# Patient Record
Sex: Male | Born: 1965 | Race: White | Hispanic: No | Marital: Single | Smoking: Never smoker
Health system: Southern US, Community
[De-identification: ages and names within clinical notes are randomized; demographics above are authoritative.]

## PROBLEM LIST (undated history)

## (undated) DIAGNOSIS — R569 Unspecified convulsions: Secondary | ICD-10-CM

## (undated) DIAGNOSIS — F32A Depression, unspecified: Secondary | ICD-10-CM

## (undated) DIAGNOSIS — F429 Obsessive-compulsive disorder, unspecified: Secondary | ICD-10-CM

## (undated) DIAGNOSIS — F329 Major depressive disorder, single episode, unspecified: Secondary | ICD-10-CM

## (undated) DIAGNOSIS — E785 Hyperlipidemia, unspecified: Secondary | ICD-10-CM

## (undated) DIAGNOSIS — F840 Autistic disorder: Secondary | ICD-10-CM

## (undated) HISTORY — DX: Major depressive disorder, single episode, unspecified: F32.9

## (undated) HISTORY — DX: Autistic disorder: F84.0

## (undated) HISTORY — DX: Unspecified convulsions: R56.9

## (undated) HISTORY — DX: Obsessive-compulsive disorder, unspecified: F42.9

## (undated) HISTORY — DX: Depression, unspecified: F32.A

## (undated) HISTORY — PX: TOOTH EXTRACTION: SUR596

## (undated) HISTORY — DX: Hyperlipidemia, unspecified: E78.5

---

## 1998-03-17 ENCOUNTER — Encounter: Admission: RE | Admit: 1998-03-17 | Discharge: 1998-03-17 | Payer: Self-pay | Admitting: Family Medicine

## 1999-02-05 ENCOUNTER — Encounter: Admission: RE | Admit: 1999-02-05 | Discharge: 1999-02-05 | Payer: Self-pay | Admitting: Family Medicine

## 1999-08-09 ENCOUNTER — Encounter: Admission: RE | Admit: 1999-08-09 | Discharge: 1999-08-09 | Payer: Self-pay | Admitting: Family Medicine

## 2000-01-02 ENCOUNTER — Encounter: Admission: RE | Admit: 2000-01-02 | Discharge: 2000-01-02 | Payer: Self-pay | Admitting: Family Medicine

## 2000-02-07 ENCOUNTER — Encounter: Admission: RE | Admit: 2000-02-07 | Discharge: 2000-02-07 | Payer: Self-pay | Admitting: Family Medicine

## 2000-04-30 ENCOUNTER — Encounter: Admission: RE | Admit: 2000-04-30 | Discharge: 2000-04-30 | Payer: Self-pay | Admitting: Family Medicine

## 2001-02-16 ENCOUNTER — Encounter: Admission: RE | Admit: 2001-02-16 | Discharge: 2001-02-16 | Payer: Self-pay | Admitting: Family Medicine

## 2001-07-17 ENCOUNTER — Encounter: Admission: RE | Admit: 2001-07-17 | Discharge: 2001-07-17 | Payer: Self-pay | Admitting: Family Medicine

## 2003-11-02 ENCOUNTER — Encounter: Admission: RE | Admit: 2003-11-02 | Discharge: 2003-11-02 | Payer: Self-pay | Admitting: Sports Medicine

## 2003-11-18 ENCOUNTER — Encounter: Admission: RE | Admit: 2003-11-18 | Discharge: 2003-11-18 | Payer: Self-pay | Admitting: Family Medicine

## 2003-12-15 ENCOUNTER — Encounter: Admission: RE | Admit: 2003-12-15 | Discharge: 2003-12-15 | Payer: Self-pay | Admitting: Family Medicine

## 2004-11-21 ENCOUNTER — Emergency Department (HOSPITAL_COMMUNITY): Admission: EM | Admit: 2004-11-21 | Discharge: 2004-11-21 | Payer: Self-pay | Admitting: Emergency Medicine

## 2006-11-17 ENCOUNTER — Ambulatory Visit: Payer: Self-pay | Admitting: Family Medicine

## 2006-12-11 ENCOUNTER — Encounter (INDEPENDENT_AMBULATORY_CARE_PROVIDER_SITE_OTHER): Payer: Self-pay | Admitting: Family Medicine

## 2006-12-11 ENCOUNTER — Ambulatory Visit: Payer: Self-pay | Admitting: Family Medicine

## 2006-12-11 LAB — CONVERTED CEMR LAB
Cholesterol: 251 mg/dL — ABNORMAL HIGH (ref 0–200)
HDL: 37 mg/dL — ABNORMAL LOW (ref >39)
Total CHOL/HDL Ratio: 6.8
VLDL: 42 mg/dL — ABNORMAL HIGH (ref 0–40)

## 2007-01-08 DIAGNOSIS — F32A Depression, unspecified: Secondary | ICD-10-CM | POA: Insufficient documentation

## 2007-01-08 DIAGNOSIS — E785 Hyperlipidemia, unspecified: Secondary | ICD-10-CM

## 2007-01-08 DIAGNOSIS — F329 Major depressive disorder, single episode, unspecified: Secondary | ICD-10-CM

## 2007-01-08 DIAGNOSIS — R569 Unspecified convulsions: Secondary | ICD-10-CM

## 2007-01-08 DIAGNOSIS — F429 Obsessive-compulsive disorder, unspecified: Secondary | ICD-10-CM | POA: Insufficient documentation

## 2007-01-16 ENCOUNTER — Ambulatory Visit: Payer: Self-pay | Admitting: Sports Medicine

## 2007-01-16 DIAGNOSIS — F840 Autistic disorder: Secondary | ICD-10-CM | POA: Insufficient documentation

## 2007-07-31 ENCOUNTER — Encounter (INDEPENDENT_AMBULATORY_CARE_PROVIDER_SITE_OTHER): Payer: Self-pay | Admitting: Family Medicine

## 2007-08-10 ENCOUNTER — Telehealth: Payer: Self-pay | Admitting: *Deleted

## 2007-08-13 ENCOUNTER — Ambulatory Visit: Payer: Self-pay | Admitting: Family Medicine

## 2007-08-13 ENCOUNTER — Encounter (INDEPENDENT_AMBULATORY_CARE_PROVIDER_SITE_OTHER): Payer: Self-pay | Admitting: Family Medicine

## 2007-08-13 LAB — CONVERTED CEMR LAB
Alkaline Phosphatase: 76 units/L (ref 39–117)
BUN: 10 mg/dL (ref 6–23)
CO2: 25 meq/L (ref 19–32)
Creatinine, Ser: 1.17 mg/dL (ref 0.40–1.50)
Glucose, Bld: 98 mg/dL (ref 70–99)
Sodium: 142 meq/L (ref 135–145)
Total Bilirubin: 0.4 mg/dL (ref 0.3–1.2)
Total Protein: 8 g/dL (ref 6.0–8.3)

## 2007-10-05 ENCOUNTER — Ambulatory Visit: Payer: Self-pay | Admitting: Family Medicine

## 2008-02-18 ENCOUNTER — Encounter (INDEPENDENT_AMBULATORY_CARE_PROVIDER_SITE_OTHER): Payer: Self-pay | Admitting: *Deleted

## 2008-03-22 ENCOUNTER — Ambulatory Visit: Payer: Self-pay | Admitting: Family Medicine

## 2008-03-30 ENCOUNTER — Encounter (INDEPENDENT_AMBULATORY_CARE_PROVIDER_SITE_OTHER): Payer: Self-pay | Admitting: Family Medicine

## 2008-03-30 ENCOUNTER — Ambulatory Visit: Payer: Self-pay | Admitting: Family Medicine

## 2008-03-30 LAB — CONVERTED CEMR LAB: Hgb A1c MFr Bld: 5.3 %

## 2008-04-07 LAB — CONVERTED CEMR LAB
Cholesterol: 225 mg/dL — ABNORMAL HIGH (ref 0–200)
HDL: 40 mg/dL (ref >39)
Total CHOL/HDL Ratio: 5.6
Triglycerides: 187 mg/dL — ABNORMAL HIGH (ref <150)
VLDL: 37 mg/dL (ref 0–40)

## 2009-08-21 ENCOUNTER — Encounter: Payer: Self-pay | Admitting: Family Medicine

## 2009-08-21 ENCOUNTER — Ambulatory Visit: Payer: Self-pay | Admitting: Family Medicine

## 2009-08-28 ENCOUNTER — Telehealth: Payer: Self-pay | Admitting: Family Medicine

## 2009-08-29 ENCOUNTER — Ambulatory Visit: Payer: Self-pay | Admitting: Family Medicine

## 2009-08-29 ENCOUNTER — Encounter: Payer: Self-pay | Admitting: Family Medicine

## 2009-08-29 LAB — CONVERTED CEMR LAB
AST: 27 units/L (ref 0–37)
Albumin: 4.4 g/dL (ref 3.5–5.2)
Alkaline Phosphatase: 65 units/L (ref 39–117)
Glucose, Bld: 86 mg/dL (ref 70–99)
LDL Cholesterol: 192 mg/dL — ABNORMAL HIGH (ref 0–99)
MCHC: 31.1 g/dL (ref 30.0–36.0)
MCV: 89.2 fL (ref 78.0–100.0)
Platelets: 359 10*3/uL (ref 150–400)
Potassium: 4.1 meq/L (ref 3.5–5.3)
RDW: 14 % (ref 11.5–15.5)
Sodium: 141 meq/L (ref 135–145)
TSH: 2.363 microintl units/mL (ref 0.350–4.500)
Total Bilirubin: 0.3 mg/dL (ref 0.3–1.2)
Total Protein: 7.3 g/dL (ref 6.0–8.3)
Triglycerides: 183 mg/dL — ABNORMAL HIGH (ref <150)
VLDL: 37 mg/dL (ref 0–40)

## 2009-09-04 ENCOUNTER — Telehealth: Payer: Self-pay | Admitting: Family Medicine

## 2009-09-05 ENCOUNTER — Telehealth: Payer: Self-pay | Admitting: Family Medicine

## 2009-09-06 ENCOUNTER — Encounter: Payer: Self-pay | Admitting: Family Medicine

## 2009-10-19 ENCOUNTER — Encounter: Payer: Self-pay | Admitting: Family Medicine

## 2010-05-21 ENCOUNTER — Telehealth: Payer: Self-pay | Admitting: Family Medicine

## 2010-05-21 ENCOUNTER — Encounter: Payer: Self-pay | Admitting: Family Medicine

## 2010-05-21 ENCOUNTER — Ambulatory Visit: Payer: Self-pay | Admitting: Family Medicine

## 2010-05-21 LAB — CONVERTED CEMR LAB
AST: 26 units/L (ref 0–37)
Albumin: 4.8 g/dL (ref 3.5–5.2)
Alkaline Phosphatase: 63 units/L (ref 39–117)
Calcium: 9.3 mg/dL (ref 8.4–10.5)
Chloride: 104 meq/L (ref 96–112)
Glucose, Bld: 92 mg/dL (ref 70–99)
Hemoglobin: 13.2 g/dL (ref 13.0–17.0)
MCHC: 30.9 g/dL (ref 30.0–36.0)
Potassium: 5 meq/L (ref 3.5–5.3)
RBC: 4.74 M/uL (ref 4.22–5.81)
RDW: 14.2 % (ref 11.5–15.5)
Sodium: 141 meq/L (ref 135–145)
Total Protein: 7.5 g/dL (ref 6.0–8.3)

## 2010-05-23 ENCOUNTER — Telehealth: Payer: Self-pay | Admitting: Family Medicine

## 2010-05-27 ENCOUNTER — Emergency Department (HOSPITAL_COMMUNITY): Admission: EM | Admit: 2010-05-27 | Discharge: 2010-05-27 | Payer: Self-pay | Admitting: Emergency Medicine

## 2010-10-10 ENCOUNTER — Encounter (INDEPENDENT_AMBULATORY_CARE_PROVIDER_SITE_OTHER): Payer: Self-pay | Admitting: *Deleted

## 2010-12-11 NOTE — Progress Notes (Signed)
  Phone Note Outgoing Call   Call placed by: Angeline Slim MD,  May 23, 2010 8:37 AM Call placed to: Specialist Summary of Call: [facility] back again.  LM that she should fax me her current office visit notes Re pt and his meds.  She can fax it to  [phone].   Initial call taken by: Reshunda Strider MD,  May 23, 2010 8:38 AM

## 2010-12-11 NOTE — Miscellaneous (Signed)
Summary: medical record request  Clinical Lists Changes  Rec'd medical record request to go to [facility] services faxed 07/06/10 Marily Memos  October 10, 2010 11:31 AM

## 2010-12-11 NOTE — Letter (Signed)
Summary: [employer] form  [employer] form   Imported By: De Nurse 05/22/2010 14:56:34  _____________________________________________________________________  External Attachment:    Type:   Image     Comment:   External Document

## 2010-12-11 NOTE — Progress Notes (Signed)
Summary: What Doses Seroqule, Prozac is pt on?  Phone Note Outgoing Call   Call placed by: Angeline Slim MD,  May 21, 2010 1:41 PM Call placed to: Specialist Summary of Call: Tried to call Harriet Long, x 2 today.  She is prescribing Seroquenl and Prozac for pt for OCD, Depression.  Wanted to touch base with her regarding how patient is doing, what doses of meds he's on.  Left message x 2.    Harriet Long also requested FLP, I will fax this after I have spoken to her.   Initial call taken by: Domnic Vantol MD,  May 21, 2010 1:43 PM

## 2010-12-11 NOTE — Assessment & Plan Note (Signed)
Summary: special olympics,df   Vital Signs:  Patient profile:   45 year old male Height:      67 inches Weight:      172 pounds BMI:     27.04 Temp:     97.5 degrees F oral Pulse rate:   80 / minute BP sitting:   123 / 75  (left arm)  Vitals Entered By: Jimmy Footman, CMA (May 21, 2010 8:34 AM) CC: Physical for special olympics Is Patient Diabetic? No Pain Assessment Patient in pain? no        Primary Care Provider:  Nicloe Frontera MD  CC:  Physical for special olympics.  History of Present Illness: 45 y/o M Here for PE for [employer] Olympics, in Aug.  He will be playing soccer and basketball.    Temp address: [address] [place], [place], [place] [address] to mail form  Seizure: Lamictal 200mg  bid Last episode of seizure 08/10/2007 Taking Lamictal since 01/2007  Seroquel and Prozac: OCD, Depression Medical review every 3-6 months, meds are reviewed at these appts.  [facility] Mount Long prescribes these meds.  [facility]  HYPERLIPIDEMIA: ONG:[id number] 40mg  qhs Compliance:yes Prob taking med:no Muscle aches:no Abd  pain:no BMI: 27.04 , change from 26.58 in 08/2009.    Habits & Providers  Alcohol-Tobacco-Diet     Alcohol drinks/day: 0     Alcohol Counseling: not indicated; patient does not drink     Tobacco Status: never  Exercise-Depression-Behavior     Does Patient Exercise: yes     Exercise Counseling: not indicated; exercise is adequate     Type of exercise: training for [employer] Olympics  Current Medications (verified): 1)  Lamictal 200 Mg Tabs (Lamotrigine) .... Take 1 Tablet By Mouth Twice A Day 2)  Prozac .... Prescribed By [employer] [facility] 3)  Seroquel .... Prescribed By [employer] [facility] 4)  Simvastatin 40 Mg Tabs (Simvastatin) .Marland Kitchen.. 1 Tab By Mouth At Bedtime For Cholesterol  Allergies (verified): 1)  Phenobarbital (Phenobarbital)  Past History:  Past Surgical History: Last updated: 01/08/2007 lipid panel 01/08: tc=251, tg=209,  hdl=37, ldl=178 - 12/12/2006  Family History: Last updated: 01/08/2007 No DM, HTN, no ther sz d/o, Skin CA, asthma, heart dz.  Social History: Last updated: 08/21/2009 Works as Public affairs consultant at [employer].  Lives in apt by himself. Parents divorced.  Sees dad every 3-4 months.  No relationship with mom.  Raised by father/maternal GM.  Grandma passed away 06-13-2009.  One brother in good health.  Sister in good health.  Athlete in [employer] ([employer]: plays soccer, basketball, floor hockey).  Member Epilepsy Adult support group.  Risk Factors: Alcohol Use: 0 (05/21/2010) Exercise: yes (05/21/2010)  Risk Factors: Smoking Status: never (05/21/2010)  Past Medical History: 1. 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 No seizure since 03/08 (Sees Dr Alvester Morin at [facility]) 2. OCD: sees International aid/development worker at [facility].  She prescribes Prozac and Seroquel. 3. Depression: same as above  Review of Systems General:  Denies chills, fatigue, fever, loss of appetite, malaise, sleep disorder, weakness, and weight loss. Eyes:  Denies blurring, discharge, eye irritation, and eye pain. ENT:  Denies decreased hearing, difficulty swallowing, ear discharge, earache, hoarseness, nosebleeds, and sore throat. CV:  Denies difficulty breathing at night, difficulty breathing while lying down, fainting, fatigue, leg cramps with exertion, lightheadness, palpitations, shortness of breath with exertion, swelling of feet, and swelling of hands. Resp:  Denies chest discomfort, chest  pain with inspiration, cough, coughing up blood, shortness of breath, sputum productive, and wheezing. GI:  Denies abdominal pain, bloody stools, change in bowel habits, constipation, dark tarry stools, diarrhea, nausea, vomiting, and vomiting blood. GU:  Denies discharge, dysuria, urinary frequency, and urinary hesitancy. MS:  Denies joint pain, joint redness, joint  swelling, loss of strength, low back pain, mid back pain, muscle aches, cramps, muscle weakness, stiffness, and thoracic pain. Derm:  Denies changes in color of skin, changes in nail beds, dryness, lesion(s), poor wound healing, and rash. Neuro:  Complains of headaches; denies brief paralysis, falling down, memory loss, poor balance, and weakness; Sometimes gets HAs, usually once a month.  Does not take meds for it.  Can last up to one day.  Usually goes away on their own. . Endo:  Denies excessive hunger, excessive thirst, excessive urination, heat intolerance, polyuria, and weight change. Heme:  Denies abnormal bruising, bleeding, fevers, and pallor. Allergy:  Complains of seasonal allergies; denies hives or rash, itching eyes, and sneezing; Seasonal allergies.  Allergic to cockroaches, mouse dander, tree pollen, dustmites.  .  Physical Exam  General:  Well-developed,well-nourished,in no acute distress; alert,appropriate and cooperative throughout examination. vitals reviewed.  Head:  normocephalic and atraumatic.   Eyes:  No corneal or conjunctival inflammation noted. EOMI. Perrla. Funduscopic exam benign, without hemorrhages, exudates or papilledema. Vision grossly normal. Ears:  External ear exam shows no significant lesions or deformities.  Otoscopic examination reveals clear canals, tympanic membranes are intact bilaterally without bulging, retraction, inflammation or discharge. Hearing is grossly normal bilaterally. Nose:  External nasal examination shows no deformity or inflammation. Nasal mucosa are pink and moist without lesions or exudates. Mouth:  Oral mucosa and oropharynx without lesions or exudates.  Teeth in good repair. Neck:  No deformities, masses, or tenderness noted. Chest Wall:  No deformities, masses, tenderness or gynecomastia noted. Lungs:  Normal respiratory effort, chest expands symmetrically. Lungs are clear to auscultation, no crackles or wheezes. Heart:  Normal rate  and regular rhythm. S1 and S2 normal without gallop, murmur, click, rub or other extra sounds. Abdomen:  Bowel sounds positive,abdomen soft and non-tender without masses, organomegaly or hernias noted. Msk:  No deformity or scoliosis noted of thoracic or lumbar spine.   Pulses:  R and L carotid,radial,femoral,dorsalis pedis and posterior tibial pulses are full and equal bilaterally Extremities:  No clubbing, cyanosis, edema, or deformity noted with normal full range of motion of all joints.   Neurologic:  No cranial nerve deficits noted. Station and gait are normal. Plantar reflexes are down-going bilaterally. DTRs are symmetrical throughout. Sensory, motor and coordinative functions appear intact. Skin:  Intact without suspicious lesions or rashes Cervical Nodes:  No lymphadenopathy noted Psych:  Cognition and judgment appear intact. Alert and cooperative with normal attention span and concentration. No apparent delusions, illusions, hallucinationsOriented X3, memory intact for recent and remote, and normally interactive.     Impression & Recommendations:  Problem # 1:  PHYSICAL EXAMINATION (ICD-V70.0) Assessment Unchanged  PE to participate in Special Olympics.  PE wnl.  WIll fill out form for pt.  I see no reason he cannot participate.  Orders: [facility] - Est  40-64 yrs ([id number])  Problem # 2:  HYPERLIPIDEMIA (ICD-272.4) Assessment: Unchanged  Taking Simva since 11/2009.  Will check direct LDL today.  BMI 27.04. Will check cmet.   His updated medication list for this problem includes:    Simvastatin 40 Mg Tabs (Simvastatin) .Marland Kitchen... 1 tab by mouth at bedtime for cholesterol  Orders: Direct LDL-[facility] [phone]) Comp Met-[facility] [phone]) [facility] - Est  40-64 yrs ([id number])  Problem # 3:  CONVULSIONS, SEIZURES, NOS (ICD-780.39) Assessment: Unchanged  No seizure activity since 07/2007.  Will continue on current dose.   His updated medication list for this problem includes:    Lamictal 200 Mg Tabs  (Lamotrigine) .Marland Kitchen... 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    Complete Medication List: 1)  Lamictal 200 Mg Tabs (Lamotrigine) .... Take 1 tablet by mouth twice a day 2)  Prozac  .... Prescribed by [facility] at [facility] 3)  Seroquel  .... Prescribed by [facility] 4)  Simvastatin 40 Mg Tabs (Simvastatin) .Marland Kitchen.. 1 tab by mouth at bedtime for cholesterol  Other Orders: CBC-[facility] ([id number]) Prescriptions: SIMVASTATIN 40 MG TABS (SIMVASTATIN) 1 tab by mouth at bedtime for cholesterol  #34 x 6   Entered and Authorized by:   Angeline Slim MD   Signed by:   Angeline Slim MD on 05/21/2010   Method used:   Electronically to        [facility] (retail)       [address]       [place], [place]  [id number]       Ph: [phone] or [phone]       Fax: [phone]   RxID:   [id number] LAMICTAL 200 MG TABS (LAMOTRIGINE) Take 1 tablet by mouth twice a day  #60 x 10   Entered and Authorized by:   Angeline Slim MD   Signed by:   Angeline Slim MD on 05/21/2010   Method used:   Electronically to        [facility] (retail)       [address]       [place], [place]  [id number]       Ph: [phone] or [phone]       Fax: [phone]   RxID:   [id number]

## 2011-02-18 ENCOUNTER — Ambulatory Visit (INDEPENDENT_AMBULATORY_CARE_PROVIDER_SITE_OTHER): Payer: Medicare Other | Admitting: Family Medicine

## 2011-02-18 ENCOUNTER — Encounter: Payer: Self-pay | Admitting: Family Medicine

## 2011-02-18 VITALS — BP 130/78 | HR 109 | Temp 97.6°F | Wt 163.6 lb

## 2011-02-18 DIAGNOSIS — B86 Scabies: Secondary | ICD-10-CM | POA: Insufficient documentation

## 2011-02-18 MED ORDER — PERMETHRIN 5 % EX CREA: TOPICAL_CREAM | CUTANEOUS | Status: AC

## 2011-02-18 NOTE — Patient Instructions (Signed)
Scabies   Scabies are small bugs (mites) that burrow under the skin and cause red bumps and severe itching. These bugs can only be seen with a microscope.   Scabies are highly contagious. They can spread easily from person to person by direct contact. They are also spread through sharing clothing or linens that have the scabies mites living in them. It is not unusual for an entire family to become infected through shared towels, clothing, or bedding.   HOME CARE INSTRUCTIONS   Your caregiver may prescribe a cream or lotion to kill the mites. If this cream is prescribed; massage the cream into the entire area of the body from the neck to the bottom of both feet. Also massage the cream into the scalp and face if your child is less than 1 year old. Avoid the eyes and mouth.   Leave the cream on for 8 to12 hours. DO NOT wash your hands after application. Your child should bathe or shower after the 8 to 12 hour application period. Sometimes it is helpful to apply the cream to your child at right before bedtime.   One treatment is usually effective and will eliminate approximately 95% of infestations. For severe cases, your caregiver may decide to repeat the treatment in 1 week. Everyone in your household should be treated with one application of the cream.   New rashes or burrows should not appear after successful treatment within 24 to 48 hours; however the itching and rash may last for 2 to 4 weeks after successful treatment. If your symptoms persist longer than this, see your caregiver.   Your caregiver also may prescribe a medication to help with the itching or to help the rash go away more quickly.   Scabies can live on clothing or linens for up to 3 days. Your entire child’s recently used clothing, towels, stuffed toys, and bed linens should be washed in hot water and then dried in a dryer for at least 20 minutes on high heat. Items that cannot be washed should be enclosed in a plastic bag for at least 3 days.   To  help relieve itching, bathe your child in a COOL bath or apply cool washcloths to the affected areas.   Your child may return to school after treatment with the prescribed cream.   SEEK MEDICAL CARE IF:   The itching persists longer than 4 weeks after treatment.   The rash spreads or becomes infected (the area has red blisters or yellow-tan crust).   Document Released: 10/28/2005 Document Re-Released: 03/16/2009   Exit[employer]® Patient Information ©2011 [employer].

## 2011-02-18 NOTE — Assessment & Plan Note (Signed)
Rash c/w scabies.  Treat with permethrin.  Handout given for proper home care and precautions.

## 2011-02-18 NOTE — Progress Notes (Signed)
  Subjective:    Patient ID: Bobby Liu, male    DOB: Jul 10, 1966, 45 y.o.   MRN: [id number]  HPI 1. Scabies?:  He has had a rash for about 1 month.  It is not getting better or worse.  It is located on his arms and legs.  Worse in the crease of his elbows.  He has had scabies before and this feels like that.  The rash is described as being red and itchy.  He hasn't tried anything for the rash.     Review of Systems Denies fevers, chills, malaise, headache    Objective:   Physical Exam  Constitutional: No distress.  Eyes: Conjunctivae are normal.  Cardiovascular: Normal rate and regular rhythm.   Pulmonary/Chest: Effort normal.  Skin:       Linear red, papules on arms and legs.  Worse in the flexor surfaces.          Assessment & Plan:

## 2011-03-01 ENCOUNTER — Ambulatory Visit (INDEPENDENT_AMBULATORY_CARE_PROVIDER_SITE_OTHER): Payer: Medicare Other | Admitting: Family Medicine

## 2011-03-01 ENCOUNTER — Encounter: Payer: Self-pay | Admitting: Family Medicine

## 2011-03-01 VITALS — BP 111/75 | HR 96 | Temp 98.2°F | Ht 68.0 in | Wt 166.0 lb

## 2011-03-01 DIAGNOSIS — R569 Unspecified convulsions: Secondary | ICD-10-CM

## 2011-03-01 DIAGNOSIS — B86 Scabies: Secondary | ICD-10-CM

## 2011-03-01 DIAGNOSIS — E785 Hyperlipidemia, unspecified: Secondary | ICD-10-CM

## 2011-03-01 DIAGNOSIS — F840 Autistic disorder: Secondary | ICD-10-CM

## 2011-03-01 DIAGNOSIS — F339 Major depressive disorder, recurrent, unspecified: Secondary | ICD-10-CM

## 2011-03-01 LAB — LIPID PANEL
Cholesterol: 189 mg/dL (ref 0–200)
Total CHOL/HDL Ratio: 4.5 Ratio

## 2011-03-01 LAB — COMPREHENSIVE METABOLIC PANEL
ALT: 18 U/L (ref 0–53)
CO2: 26 mEq/L (ref 19–32)
Calcium: 9.8 mg/dL (ref 8.4–10.5)
Chloride: 102 mEq/L (ref 96–112)
Creat: 1.03 mg/dL (ref 0.40–1.50)
Glucose, Bld: 82 mg/dL (ref 70–99)

## 2011-03-01 LAB — CBC
HCT: 41.3 % (ref 39.0–52.0)
Hemoglobin: 13.4 g/dL (ref 13.0–17.0)
MCV: 89 fL (ref 78.0–100.0)
Platelets: 346 10*3/uL (ref 150–400)
RBC: 4.64 MIL/uL (ref 4.22–5.81)
WBC: 6.6 10*3/uL (ref 4.0–10.5)

## 2011-03-01 MED ORDER — PERMETHRIN 5 % EX CREA: TOPICAL_CREAM | CUTANEOUS | Status: DC

## 2011-03-01 NOTE — Assessment & Plan Note (Signed)
No change.  He is doing well.  Living on his own.

## 2011-03-01 NOTE — Assessment & Plan Note (Signed)
Scabies on b/l arms, back, and abd region.  Will treat with Elimite.  He is getting a new mattress today, and will wash all his clothes and linens and towels in hot water.  What he cannot wash he will place in plastic bag and tie it up for 24 days.

## 2011-03-01 NOTE — Assessment & Plan Note (Signed)
Doing well on Prozac

## 2011-03-01 NOTE — Assessment & Plan Note (Addendum)
No seizure since 2008.  Taking Lamictal.   Will check Cr today.

## 2011-03-01 NOTE — Assessment & Plan Note (Addendum)
Taking Simva 40.  FLP in 08/2009 showed LDL 183, HDL 45, Trig 192; not at goal of LDL <130.  Will check FLP today and dose Simva if needed.  Will check Cmet.

## 2011-03-01 NOTE — Progress Notes (Signed)
  Subjective:    Patient ID: Bobby Liu, male    DOB: 1966/09/27, 45 y.o.   MRN: [id number]  HPI 44 y/o M Here for PE for Special Olympics, in Aug.  He will be playing soccer and basketball.   Rash: Thinks he has scabies.  He thinks he lives in a house that is "not very clean".  The house has since has been sprayed by Fredrik Rigger.  He is also taking measures to increase his hygiene.  He has washed linens and clothing in hot water.  Other items he has placed in plastic bag.  Seizure: Lamictal 200mg  bid Last episode of seizure 08/10/2007 Taking Lamictal since 01/2007  Seroquel and Prozac: OCD, Depression Medical review every 3-6 months, meds are reviewed at these appts.  [facility] prescribes these meds.  [facility].  HYPERLIPIDEMIA: WUJ:[id number] 40mg  qhs Compliance:yes Prob taking med:no Muscle aches:no Abd  pain:no    Review of Systems  Constitutional: Negative for fever, chills, diaphoresis, activity change, appetite change and fatigue.  HENT: Positive for rhinorrhea and dental problem. Negative for hearing loss, ear pain, sneezing, mouth sores, neck stiffness and ear discharge.        Fracture tooth that will need to be extracted. He will need oral surgery and then implant.   Eyes: Negative for redness, itching and visual disturbance.       New glasses sometimes in 2011.  Sees well with them.   Respiratory: Negative for choking, shortness of breath and wheezing.   Cardiovascular: Negative for chest pain, palpitations and leg swelling.  Gastrointestinal: Negative for nausea, vomiting, abdominal pain, diarrhea and constipation.  Genitourinary: Negative for dysuria, discharge, penile swelling, genital sores, penile pain and testicular pain.  Musculoskeletal: Negative for back pain and joint swelling.  Skin: Positive for rash.  Neurological: Negative for dizziness, tremors, syncope, weakness and headaches.  Hematological: Negative for adenopathy. Does not bruise/bleed  easily.  Psychiatric/Behavioral: Negative for hallucinations, sleep disturbance and decreased concentration.       Objective:   Physical Exam  Constitutional: He is oriented to person, place, and time. He appears well-developed and well-nourished. No distress.  HENT:  Head: Normocephalic and atraumatic.  Right Ear: External ear normal.  Left Ear: External ear normal.  Nose: Nose normal.  Mouth/Throat: Oropharynx is clear and moist. No oropharyngeal exudate.  Eyes: Conjunctivae and EOM are normal. Pupils are equal, round, and reactive to light.  Neck: Normal range of motion. Neck supple. No thyromegaly present.  Cardiovascular: Normal rate, regular rhythm, normal heart sounds and intact distal pulses.   No murmur heard. Pulmonary/Chest: Effort normal and breath sounds normal. No respiratory distress. He has no wheezes.  Abdominal: Soft. Bowel sounds are normal. He exhibits no distension and no mass. There is no tenderness.  Musculoskeletal: Normal range of motion. He exhibits no edema and no tenderness.  Lymphadenopathy:    He has no cervical adenopathy.  Neurological: He is alert and oriented to person, place, and time. He has normal reflexes. He displays normal reflexes. No cranial nerve deficit.  Skin: Skin is warm. Rash noted. No erythema.  Psychiatric: He has a normal mood and affect. His behavior is normal. Judgment and thought content normal.          Assessment & Plan:

## 2011-03-01 NOTE — Patient Instructions (Signed)
It was great seeing you again today. 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   I will call you with lab results.    Scabies Scabies are small bugs (mites) that burrow under the skin and cause red bumps and severe itching. These bugs can only be seen with a microscope.  Scabies are highly contagious. They can spread easily from person to person by direct contact. They are also spread through sharing clothing or linens that have the scabies mites living in them. It is not unusual for an entire family to become infected through shared towels, clothing, or bedding.  HOME CARE INSTRUCTIONS  Your caregiver may prescribe a cream or lotion to kill the mites. If this cream is prescribed; massage the cream into the entire area of the body from the neck to the bottom of both feet. Also massage the cream into the scalp and face if your child is less than 41 year old. Avoid the eyes and mouth.   Leave the cream on for 8 to12 hours. DO NOT wash your hands after application. Your child should bathe or shower after the 8 to 12 hour application period. Sometimes it is helpful to apply the cream to your child at right before bedtime.   One treatment is usually effective and will eliminate approximately 95% of infestations. For severe cases, your caregiver may decide to repeat the treatment in 1 week. Everyone in your household should be treated with one application of the cream.   New rashes or burrows should not appear after successful treatment within 24 to 48 hours; however the itching and rash may last for 2 to 4 weeks after successful treatment. If your symptoms persist longer than this, see your caregiver.   Your caregiver also may prescribe a medication to help with the itching or to help the rash go away more quickly.   Scabies can live on clothing or linens for up to 3 days. Your entire child's recently used clothing, towels, stuffed toys, and bed linens should be washed in hot water and then dried in  a dryer for at least 20 minutes on high heat. Items that cannot be washed should be enclosed in a plastic bag for at least 3 days.   To help relieve itching, bathe your child in a COOL bath or apply cool washcloths to the affected areas.   Your child may return to school after treatment with the prescribed cream.  SEEK MEDICAL CARE IF:  The itching persists longer than 4 weeks after treatment.   The rash spreads or becomes infected (the area has red blisters or yellow-tan crust).  Document Released: 10/28/2005 Document Re-Released: 03/16/2009 [facility] Patient Information 2011 [place], [place].

## 2011-06-25 ENCOUNTER — Other Ambulatory Visit: Payer: Self-pay | Admitting: Family Medicine

## 2011-06-25 NOTE — Telephone Encounter (Signed)
Will have staff call pt to schedule an appointment with me, his new provider.

## 2011-06-25 NOTE — Telephone Encounter (Signed)
Refill request

## 2011-08-09 ENCOUNTER — Telehealth: Payer: Self-pay | Admitting: Family Medicine

## 2011-08-09 NOTE — Telephone Encounter (Signed)
Pt advised 11/08 for last Td.

## 2011-08-09 NOTE — Telephone Encounter (Signed)
Mr. Hinshaw needs to know when his most recent Tetanus shot was.

## 2011-08-12 ENCOUNTER — Encounter: Payer: Medicare Other | Admitting: Family Medicine

## 2011-09-09 ENCOUNTER — Other Ambulatory Visit: Payer: Self-pay | Admitting: Family Medicine

## 2011-09-09 NOTE — Telephone Encounter (Signed)
Refill request

## 2011-09-23 ENCOUNTER — Ambulatory Visit (INDEPENDENT_AMBULATORY_CARE_PROVIDER_SITE_OTHER): Payer: Medicare Other | Admitting: Family Medicine

## 2011-09-23 VITALS — BP 114/73 | HR 134 | Temp 98.1°F | Ht 68.0 in | Wt 163.0 lb

## 2011-09-23 DIAGNOSIS — S91209A Unspecified open wound of unspecified toe(s) with damage to nail, initial encounter: Secondary | ICD-10-CM | POA: Insufficient documentation

## 2011-09-23 DIAGNOSIS — I498 Other specified cardiac arrhythmias: Secondary | ICD-10-CM

## 2011-09-23 DIAGNOSIS — S91109A Unspecified open wound of unspecified toe(s) without damage to nail, initial encounter: Secondary | ICD-10-CM

## 2011-09-23 DIAGNOSIS — R000 Tachycardia, unspecified: Secondary | ICD-10-CM | POA: Insufficient documentation

## 2011-09-23 MED ORDER — BACITRACIN-NEOMYCIN-POLYMYXIN 400-5-5000 EX OINT: TOPICAL_OINTMENT | Freq: Two times a day (BID) | CUTANEOUS | Status: AC

## 2011-09-23 NOTE — Progress Notes (Signed)
  Subjective:    Patient ID: Bobby Liu, male    DOB: January 28, 1966, 45 y.o.   MRN: [id number]  HPI Pt reports toe pain x 1 week. Pt states that he was playing soccer, kicked soccer ball, and noticed significant pain in L great toe. Pain has been managed by OTC tylenol. Pt subsided within 2-3 days. Pt has not used tylenol since this point. Pt denies any fever, great toe purulent drainage, or great toe erythema. Pt has been ambulating on toe with minimal pain since over the weekend.   Review of Systems See HPI     Objective:   Physical Exam Gen: well appearing.  CV: tachycardic, no murmur.  EXT: Noted great toe nail near complete avulsion with toe nail at cuticle being intact. Minimal peripheral erythema. No purulent drainage.     Assessment & Plan:

## 2011-09-23 NOTE — Assessment & Plan Note (Addendum)
Asymptomatic. HR rechecked, HR in the 120s. This may secondary to nervousness from toenail avulsion. Pt with noted prior visits with HR in 100s. Discussed red flags for reevaluation. Pt instructed to follow up with PCP if he develops any CP, SOB.

## 2011-09-23 NOTE — Assessment & Plan Note (Signed)
Case reviewed with Rodney Langton. Great toe nail was removed by hand without anesthetic by sports medicine fellow. Pt tolerated this well without complaint of pain. Discussed no direct trauma to great toe over next 2-4 weeks. Neosporin was placed on remainder of cuticle and great toe was dressed. Rx for neosporin and extra dressings given. Discussed infectious red flags at length. Handout given. Will follow up prn.

## 2011-09-23 NOTE — Patient Instructions (Signed)
Nail Avulsion Injury Nail avulsion means that you have lost the whole, or part of a nail. The nail will usually grow back in 2 to 6 months. If your injury damaged the growth center of the nail, the nail may be deformed, split, or not stuck to the nail bed. Sometimes the avulsed nail is stitched back in place. This provides temporary protection to the nail bed until the new nail grows in.  HOME CARE INSTRUCTIONS   Raise (elevate) your injury as much as possible.   Protect the injury and cover it with bandages (dressings) or splints as instructed.   Change dressings as instructed.  SEEK MEDICAL CARE IF:   There is increasing pain, redness, or swelling.   You cannot move your fingers or toes.  Document Released: 12/05/2004 Document Revised: 07/10/2011 Document Reviewed: 09/29/2009 [facility] Patient Information 2012 [place], [place].

## 2011-11-04 ENCOUNTER — Other Ambulatory Visit: Payer: Self-pay | Admitting: Family Medicine

## 2011-11-06 NOTE — Telephone Encounter (Signed)
Refill request

## 2012-03-05 ENCOUNTER — Other Ambulatory Visit: Payer: Self-pay | Admitting: Family Medicine

## 2012-03-17 ENCOUNTER — Ambulatory Visit (INDEPENDENT_AMBULATORY_CARE_PROVIDER_SITE_OTHER): Payer: Medicare Other | Admitting: Family Medicine

## 2012-03-17 ENCOUNTER — Encounter: Payer: Self-pay | Admitting: Family Medicine

## 2012-03-17 VITALS — BP 127/76 | HR 94 | Ht 68.0 in | Wt 156.0 lb

## 2012-03-17 DIAGNOSIS — F840 Autistic disorder: Secondary | ICD-10-CM

## 2012-03-17 DIAGNOSIS — F429 Obsessive-compulsive disorder, unspecified: Secondary | ICD-10-CM

## 2012-03-17 DIAGNOSIS — R569 Unspecified convulsions: Secondary | ICD-10-CM

## 2012-03-17 DIAGNOSIS — E785 Hyperlipidemia, unspecified: Secondary | ICD-10-CM

## 2012-03-17 DIAGNOSIS — F339 Major depressive disorder, recurrent, unspecified: Secondary | ICD-10-CM

## 2012-03-17 LAB — LDL CHOLESTEROL, DIRECT: Direct LDL: 135 mg/dL — ABNORMAL HIGH

## 2012-03-17 LAB — COMPREHENSIVE METABOLIC PANEL
ALT: 21 U/L (ref 0–53)
CO2: 31 mEq/L (ref 19–32)
Potassium: 3.7 mEq/L (ref 3.5–5.3)
Sodium: 142 mEq/L (ref 135–145)
Total Bilirubin: 0.2 mg/dL — ABNORMAL LOW (ref 0.3–1.2)
Total Protein: 6.6 g/dL (ref 6.0–8.3)

## 2012-03-17 MED ORDER — LAMOTRIGINE 200 MG PO TABS: 200.0000 mg | ORAL_TABLET | Freq: Every day | ORAL | Status: DC

## 2012-03-17 NOTE — Patient Instructions (Signed)
Labs: I will mail you your lab results.   Refills have been sent in.  Return for recheck in 1 year or sooner if needed.

## 2012-03-17 NOTE — Progress Notes (Signed)
  Subjective:    Patient ID: Bobby Liu, male    DOB: Oct 26, 1966, 46 y.o.   MRN: [id number]  HPI  1) autism In group home by the name of [facility]- staff member stays with patient 24 hours per day.  They help with transportation.  Not any close family nearby- most are out of state.  Pt states that he is happy with these arrangements  2)seizure: lamictal has controlled seizures. Last episode of seizure 08/09/1997.  Started on lamictal on 01/18/1997.  Family practice has been prescribing this. Originally started by neurology at [facility]- has not been seen by them in years. Pt agrees to lab draw for renal and liver function today.   3)OCD/depression: At [facility]- sees Harriett Long and mental health team there--  Prescribes seroquel and prozac.   See her every 4-6 months.  States that these issues are well controlled currently.   4)hyperlipidemia- Pt takes zocor 40mg  daily.  Pt is due for recheck- agrees to ldl screen today.   Review of Systems As per above. No fever. No cold symptoms. No weight changes. No problem with appetite. No diarrhea. No n/v.     Objective:   Physical Exam  Constitutional: He appears well-developed and well-nourished.       Hair disheveled.  HENT:  Head: Normocephalic and atraumatic.  Right Ear: External ear normal.  Left Ear: External ear normal.  Mouth/Throat: No oropharyngeal exudate.  Eyes: Conjunctivae are normal. Right eye exhibits no discharge. Left eye exhibits no discharge.  Cardiovascular: Normal rate, regular rhythm and normal heart sounds.   No murmur heard. Pulmonary/Chest: Effort normal. No respiratory distress. He has no wheezes. He has no rales.  Abdominal: Soft. He exhibits no distension.  Musculoskeletal: He exhibits no edema.  Neurological: He is alert.  Skin: No rash noted.  Psychiatric: Thought content normal.       Flat affect,           Assessment & Plan:

## 2012-03-18 NOTE — Assessment & Plan Note (Signed)
Followed by Berton Mount long- [facility].

## 2012-03-18 NOTE — Assessment & Plan Note (Signed)
Since seizures have been well controlled on lamictal will continue this- rx sent to pharmacy.  Will do monitoring liver and renal function tests today.  Pt states that he is not interested in stopping this b/c off of it he had uncontrolled seizures- and on it has not had seizures since 1998.  Could consider referral back to neurology at future time if any input needed.  Pt doesn't want referral back at this time.

## 2012-03-18 NOTE — Assessment & Plan Note (Signed)
Pt followed by Bobby Liu long- of [facility]- continue to go to scheduled appts for refills and monitoring for prozac and quetiapine.

## 2012-03-18 NOTE — Assessment & Plan Note (Signed)
Care by group home [facility]. -- pt to continue to see mental health provider for help with this problem.

## 2012-03-18 NOTE — Assessment & Plan Note (Signed)
Recheck LDL today to make sure that it is wnl with current dosage of simvastatin.

## 2012-03-19 ENCOUNTER — Encounter: Payer: Self-pay | Admitting: Family Medicine

## 2012-03-19 ENCOUNTER — Telehealth: Payer: Self-pay | Admitting: Family Medicine

## 2012-03-19 NOTE — Telephone Encounter (Signed)
Attempt x 1 to call pt with cholesterol results.  No answer.  Will send letter.

## 2013-03-22 ENCOUNTER — Other Ambulatory Visit: Payer: Self-pay | Admitting: Family Medicine

## 2013-04-14 ENCOUNTER — Ambulatory Visit (INDEPENDENT_AMBULATORY_CARE_PROVIDER_SITE_OTHER): Payer: Medicare Other | Admitting: Family Medicine

## 2013-04-14 VITALS — BP 127/84 | HR 89 | Temp 98.1°F | Ht 69.0 in | Wt 165.0 lb

## 2013-04-14 DIAGNOSIS — M549 Dorsalgia, unspecified: Secondary | ICD-10-CM

## 2013-04-14 LAB — POCT URINALYSIS DIPSTICK
Bilirubin, UA: NEGATIVE
Ketones, UA: NEGATIVE
Nitrite, UA: NEGATIVE
pH, UA: 7

## 2013-04-14 LAB — POCT UA - MICROSCOPIC ONLY

## 2013-04-14 MED ORDER — IBUPROFEN 600 MG PO TABS: 600.0000 mg | ORAL_TABLET | Freq: Three times a day (TID) | ORAL | Status: DC | PRN

## 2013-04-14 NOTE — Addendum Note (Signed)
Addended by: [place], Murrel Freet on: 04/14/2013 04:02 PM   Modules accepted: Orders

## 2013-04-14 NOTE — Progress Notes (Signed)
  Subjective:    Patient ID: Bobby Liu, male    DOB: February 28, 1966, 47 y.o.   MRN: [id number]  HPI # SDA: lower back pain, mostly left side It started this morning when he got up, 5/10 pain, achey This is the first time he has had back pain It hurts to bend over, sit up  He slept in his regular bed last night  Medications tried:  -Acetaminophen: he thinks it might have helped; he took 2 tablets  Review of Systems Denies fevers, chills, leg numbness or tingling, urinary/stool incontinence, dysuria/frequency/urgency, nausea/vomiting, chest pain, dyspnea  Allergies, medication, past medical history reviewed.  Smoking status noted. No history of back problems      Objective:   Physical Exam GEN: NAD; well-nourished, -appearing PSYCH: speaks slowly and deliberately but appropriate to questions BACK:     Appearance: no rash or swelling   Tenderness: mild over left lower lumbar area; no mid-line tenderness   ROM: mild worsening of pain with flexion of back and improvement with extension; no changes with lateral flexion   HIPS: negative Faber, negative SLR   Sensation: intact throughout lower extremities   GAIT: non antalgic    Assessment & Plan:

## 2013-04-14 NOTE — Patient Instructions (Addendum)
I think you might have strained a muscle -Try ibuprofen 600 mg every 8 hours as needed   For the next 2 days, you may just want to take ibuprofen every 8 hours then after that you can decide if you want to still take it depending on how you feel  Other things -Warm compresses 20 minutes at a time  If the pain gets worse or does not improve in the next 2 weeks

## 2013-04-14 NOTE — Assessment & Plan Note (Signed)
Suspect muscle strain. He may have slept on it "funny".  -Ibuprofen prn -Warm compresses -Discussed red flags; follow-up prn or if no improvement next few weeks

## 2013-04-23 ENCOUNTER — Other Ambulatory Visit: Payer: Self-pay | Admitting: Family Medicine

## 2013-04-23 ENCOUNTER — Encounter: Payer: Self-pay | Admitting: Family Medicine

## 2013-04-23 NOTE — Telephone Encounter (Signed)
This encounter was created in error - please disregard.

## 2013-04-28 ENCOUNTER — Telehealth: Payer: Self-pay | Admitting: Family Medicine

## 2013-04-28 NOTE — Telephone Encounter (Signed)
Will fwd to MD for review.  Aamilah Augenstein L, CMA  

## 2013-04-28 NOTE — Telephone Encounter (Signed)
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 Pharmacy states he has no refills and they had contacted family medicine about this but have not received an authorization Please advise

## 2013-04-28 NOTE — Telephone Encounter (Signed)
Medication refilled on 04/23/2013.  Called pharmacy to let them know.  Pt also needs an OV with MD.  Pharmacy to let patient know.  Moody Robben, Darlyne Russian, CMA

## 2013-05-24 ENCOUNTER — Other Ambulatory Visit: Payer: Self-pay | Admitting: Family Medicine

## 2013-05-24 DIAGNOSIS — R569 Unspecified convulsions: Secondary | ICD-10-CM

## 2013-05-24 NOTE — Telephone Encounter (Signed)
Left message that we refilled pt's Lamictil but that he needs to schedule OV with new MD prior to any refills.  Javyon Fontan, Darlyne Russian, CMA

## 2013-05-24 NOTE — Telephone Encounter (Signed)
I will give you Mr. Bowen a refill of his Lamictal for a 30 day supply. He needs to come in for an office visit with me within a month.

## 2013-06-28 ENCOUNTER — Encounter: Payer: Medicare Other | Admitting: Family Medicine

## 2013-07-13 ENCOUNTER — Encounter: Payer: Medicare Other | Admitting: Family Medicine

## 2013-07-19 ENCOUNTER — Encounter: Payer: Self-pay | Admitting: Family Medicine

## 2013-07-19 ENCOUNTER — Ambulatory Visit (INDEPENDENT_AMBULATORY_CARE_PROVIDER_SITE_OTHER): Payer: Medicare Other | Admitting: Family Medicine

## 2013-07-19 VITALS — BP 120/69 | HR 110 | Temp 98.7°F | Ht 68.0 in | Wt 169.0 lb

## 2013-07-19 DIAGNOSIS — R569 Unspecified convulsions: Secondary | ICD-10-CM

## 2013-07-19 DIAGNOSIS — G40802 Other epilepsy, not intractable, without status epilepticus: Secondary | ICD-10-CM

## 2013-07-19 DIAGNOSIS — E785 Hyperlipidemia, unspecified: Secondary | ICD-10-CM

## 2013-07-19 DIAGNOSIS — Z23 Encounter for immunization: Secondary | ICD-10-CM

## 2013-07-19 LAB — CBC WITH DIFFERENTIAL/PLATELET
Hemoglobin: 14.3 g/dL (ref 13.0–17.0)
Lymphocytes Relative: 24 % (ref 12–46)
Lymphs Abs: 2.1 10*3/uL (ref 0.7–4.0)
Monocytes Relative: 11 % (ref 3–12)
Neutro Abs: 5.5 10*3/uL (ref 1.7–7.7)
Neutrophils Relative %: 64 % (ref 43–77)
RBC: 5.06 MIL/uL (ref 4.22–5.81)

## 2013-07-19 MED ORDER — LAMOTRIGINE 200 MG PO TABS: 200.0000 mg | ORAL_TABLET | Freq: Every day | ORAL | Status: DC

## 2013-07-19 NOTE — Assessment & Plan Note (Signed)
lipid panel checked today.

## 2013-07-19 NOTE — Assessment & Plan Note (Signed)
Lamictal refilled. States no side effects from the medication and has had no seizures since 1998 since beginning medication. - CMET, CBC w/ diff were checked for monitoring for continued use of medication

## 2013-07-19 NOTE — Progress Notes (Signed)
Subjective:     Patient ID: Bobby Liu, male   DOB: 1966-05-31, 47 y.o.   MRN: [id number]  HPI 1. Seizure: Last seizure in 1998. Seizures have been controlled with Lamictal since then. States that he will continue taking the Lamictal and has no side effects from it.  2.Physical: He is here for a physical evaluation for participation in [employer]. He has never had any trauma for participation. He reports no shortness of breath, chest pain, abdominal pain, decreased sensation in his hands or toes, and no decreased strength.  He has been an active participant since 2005.  3. health maintenance: Recommend flu shot  Review of Systems Within normal limits other than noted above    Objective:   Physical Exam BP 120/69  Pulse 110  Temp(Src) 98.7 F (37.1 C) (Oral)  Ht 5\' 8"  (1.727 m)  Wt 169 lb (76.658 kg)  BMI 25.7 kg/m2 Gen: NAD, alert, cooperative with exam HEENT: NCAT, EOMI, PERRL CV: RRR, good S1/S2, no murmur Resp: CTABL, no wheezes, non-labored Abd: SNTND, BS present, no guarding or organomegaly MSK: 5 out of 5 strength in upper and lower charities. Normal range of motion in shoulders, knees, ankles, elbows. Normal gait  Ext: No edema, warm. +2 pulses in upper and lower extremities.  Neuro: Alert and oriented, No gross deficits. Cranial nerves II through XII intact. Reflexes are normal.      Assessment:     1. Seizures 2. Physical  3. Health maintenance    Plan:     3. received flu shot today

## 2013-07-19 NOTE — Patient Instructions (Signed)
Thank you for coming in,   I refilled your Lamictal today.   I hope that you have a great time at [employer]. Let me know how your team does and look forward to seeing you in the future.    Please feel free to call with any questions or concerns at any time, at [phone]. --Dr. Jordan Likes

## 2013-07-20 LAB — COMPREHENSIVE METABOLIC PANEL
ALT: 18 U/L (ref 0–53)
AST: 15 U/L (ref 0–37)
Albumin: 4.2 g/dL (ref 3.5–5.2)
CO2: 29 mEq/L (ref 19–32)
Calcium: 9.4 mg/dL (ref 8.4–10.5)
Chloride: 101 mEq/L (ref 96–112)
Potassium: 4.1 mEq/L (ref 3.5–5.3)
Total Protein: 7.2 g/dL (ref 6.0–8.3)

## 2013-07-20 LAB — LIPID PANEL
LDL Cholesterol: 141 mg/dL — ABNORMAL HIGH (ref 0–99)
VLDL: 68 mg/dL — ABNORMAL HIGH (ref 0–40)

## 2013-07-26 ENCOUNTER — Encounter: Payer: Self-pay | Admitting: Family Medicine

## 2013-11-16 ENCOUNTER — Encounter: Payer: Self-pay | Admitting: Family Medicine

## 2013-11-16 ENCOUNTER — Ambulatory Visit (INDEPENDENT_AMBULATORY_CARE_PROVIDER_SITE_OTHER): Payer: Medicare Other | Admitting: Family Medicine

## 2013-11-16 VITALS — BP 126/80 | HR 98 | Temp 98.7°F | Ht 68.0 in | Wt 166.0 lb

## 2013-11-16 DIAGNOSIS — R21 Rash and other nonspecific skin eruption: Secondary | ICD-10-CM | POA: Insufficient documentation

## 2013-11-16 LAB — POCT SKIN KOH: Skin KOH, POC: NEGATIVE

## 2013-11-16 MED ORDER — NYSTATIN 100000 UNIT/GM EX CREA: 1.0000 "application " | TOPICAL_CREAM | Freq: Two times a day (BID) | CUTANEOUS | Status: DC

## 2013-11-16 NOTE — Patient Instructions (Addendum)
It looks like a yeast infection on your inner thighs. I have sent in Nystatin cream to use for 2 weeks.   STop the hydrocortisone.    The rest is dry skin which can be helped with moisturizer creams.  I have called this medicine in for you.  Please come back in 3-4 weeks if no improvement.  Come back sooner if worsening.

## 2013-11-16 NOTE — Progress Notes (Signed)
   Subjective:     Bobby Liu is a 48 y.o. male who presents for evaluation of a rash involving the bilateral groin/inner thighs.. Rash started 2 weeks ago approximately. Lesions are red, and scaly in texture. Rash has changed over time. Rash is pruritic. Associated symptoms: none. Patient denies: decrease in appetite, decrease in energy level and recent illnesses.. Patient has not had contacts with similar rash. Patient has not had new exposures (soaps, lotions, laundry detergents, foods, medications, plants, insects or animals).  Has tried hydrocortisone which relieves the itching but not the redness/rash.  The following portions of the patient's history were reviewed and updated as appropriate: allergies, current medications, past family history, past medical history, past social history, past surgical history and problem list.  Review of Systems Pertinent items are noted in HPI.    Objective:    BP 126/80  Pulse 98  Temp(Src) 98.7 F (37.1 C) (Oral)  Ht 5\' 8"  (1.727 m)  Wt 166 lb (75.297 kg)  BMI 25.25 kg/m2 General:  alert, cooperative and no distress  Skin:  normal and red scaly rash noted BL groin area.  Some satelite lesions. Spares genitalia.  Some dry skin noted inner thighs as well with minimal erythema.

## 2013-11-16 NOTE — Assessment & Plan Note (Addendum)
KOH skin scraping:  Revealed at least 1 yeast bud (poor sample scraping -- to much epithelium) Looks like candida yeast.   Plan to treat with nystatin for 2 weeks and moisturizer creams inner thighs for what appears to be xerosis.   FU in 3-4 weeks if no improvement.

## 2013-12-08 ENCOUNTER — Other Ambulatory Visit: Payer: Self-pay | Admitting: Family Medicine

## 2013-12-09 ENCOUNTER — Other Ambulatory Visit: Payer: Self-pay | Admitting: Family Medicine

## 2013-12-09 DIAGNOSIS — R21 Rash and other nonspecific skin eruption: Secondary | ICD-10-CM

## 2013-12-09 MED ORDER — NYSTATIN 100000 UNIT/GM EX CREA: 1.0000 "application " | TOPICAL_CREAM | Freq: Two times a day (BID) | CUTANEOUS | Status: DC

## 2013-12-09 NOTE — Telephone Encounter (Signed)
In Dr. Tyson AliasWalden's visit note, he notes follow up in 3-4 weeks if symptoms not getting better. Refill today but schedule follow up in 2 weeks if not improving.

## 2013-12-24 ENCOUNTER — Telehealth: Payer: Self-pay | Admitting: Family Medicine

## 2013-12-24 ENCOUNTER — Other Ambulatory Visit: Payer: Self-pay | Admitting: Family Medicine

## 2013-12-24 NOTE — Telephone Encounter (Signed)
I have refilled his medication for the yeast infection even though I did not initiate the prescription. Will need to be re-evaluated in order if there is another process going on or possible referral.

## 2013-12-24 NOTE — Telephone Encounter (Signed)
LVM for patient to call back to schedule an appointment with Dr. Jordan LikesSchmitz to be re-evaluated before refill can be done per doctor

## 2013-12-24 NOTE — Telephone Encounter (Signed)
Pt was seen for a yeast infection and was given medication. The medication has run out and still has the itching. Can another prescription be called in or does he need an appointment? Please call and advise on what she be done. Please call Bobby Liu at [phone]. jw

## 2013-12-24 NOTE — Telephone Encounter (Signed)
Please advise. Bobby Liu  

## 2013-12-27 ENCOUNTER — Encounter: Payer: Self-pay | Admitting: Family Medicine

## 2013-12-27 ENCOUNTER — Other Ambulatory Visit: Payer: Self-pay | Admitting: *Deleted

## 2013-12-27 ENCOUNTER — Ambulatory Visit (INDEPENDENT_AMBULATORY_CARE_PROVIDER_SITE_OTHER): Payer: Medicare Other | Admitting: Family Medicine

## 2013-12-27 VITALS — BP 136/84 | HR 118 | Temp 98.3°F | Ht 68.0 in | Wt 156.0 lb

## 2013-12-27 DIAGNOSIS — L299 Pruritus, unspecified: Secondary | ICD-10-CM

## 2013-12-27 DIAGNOSIS — B359 Dermatophytosis, unspecified: Secondary | ICD-10-CM

## 2013-12-27 DIAGNOSIS — R21 Rash and other nonspecific skin eruption: Secondary | ICD-10-CM

## 2013-12-27 MED ORDER — HYDROCORTISONE 2.5 % EX OINT: TOPICAL_OINTMENT | Freq: Two times a day (BID) | CUTANEOUS | Status: DC

## 2013-12-27 MED ORDER — TERBINAFINE HCL 1 % EX CREA: 1.0000 "application " | TOPICAL_CREAM | Freq: Two times a day (BID) | CUTANEOUS | Status: DC

## 2013-12-27 NOTE — Assessment & Plan Note (Addendum)
Looks to be tinea cruris. Nystatin with mild symptom relief and no prolonged treatment. Afebrile, well appearing, vaccinated patient, with no new mediations.  - terbinafine BID for 14 days  - Hydrocortisone 2.5 ointment for itching relief. Stop once itching has resolved  - f/u in two weeks if symptoms persist  - may need to try an oral medication if symptoms persists.   - Discussed with Dr. Deirdre Priesthambliss

## 2013-12-27 NOTE — Progress Notes (Signed)
    Subjective:     Patient ID: Bobby Liu, male   DOB: [birth year], 48 y.o.   MRN: [id number]  HPI Bobby RileyChristophe L Kafer is here for followup for his rash followup.  He was seen in January for an itchy rash. He was prescribed nystatin cream at that time. I have refilled this one time but was not the initiating provider so I am seeing him for followup. The rash is located in his groin. The nystatin cream has helped but always seems to return. He has not changed any soaps or detergent. It is in the same location and the same size as previous. There has been no oozing from it and he has not started any new medications other than the nystatin cream. He is well appearing has been afebrile.   Current Outpatient Prescriptions on File Prior to Visit  Medication Sig Dispense Refill  . FLUoxetine (PROZAC) 10 MG capsule [facility]       . ibuprofen (ADVIL,MOTRIN) 600 MG tablet Take 1 tablet (600 mg total) by mouth every 8 (eight) hours as needed for pain.  30 tablet  0  . lamoTRIgine (LAMICTAL) 200 MG tablet Take 1 tablet (200 mg total) by mouth daily.  90 tablet  3  . nystatin cream (MYCOSTATIN) Apply 1 application topically 2 (two) times daily. QS X 2 weeks  30 g  0   No current facility-administered medications on file prior to visit.     Review of Systems See history of present illness    Objective:   Physical Exam BP 136/84  Pulse 118  Temp(Src) 98.3 F (36.8 C) (Oral)  Ht 5\' 8"  (1.727 m)  Wt 156 lb (70.761 kg)  BMI 23.73 kg/m2 Gen: NAD, alert, cooperative with exam, well-appearing Skin: erythematous rash on b/l inner thigh and penis, slightly raised, well demarcated, non-draining, dry  Psych: good insight, alert and oriented     Assessment:         Plan:

## 2013-12-27 NOTE — Patient Instructions (Signed)
Thank you for coming in,   I called in Terbinafine and hydrocortisone.  Please apply these two time a day. They will need to be applied at different times, six hours apart.   Please stop the Hydrocortisone ointment when the itching stops.   You will need to use the Terbinafine for a full 14 days, twice a day.   Please follow up with me after the 14 days if the rash is still present.    Please feel free to call with any questions or concerns at any time, at [phone]. --Dr. Jordan LikesSchmitz

## 2014-01-04 NOTE — Telephone Encounter (Signed)
Left 2nd  message on patients voicemail. Jaice Digioia, Virgel BouquetGiovanna S

## 2014-07-05 ENCOUNTER — Encounter: Payer: Medicare Other | Admitting: Family Medicine

## 2014-07-06 ENCOUNTER — Encounter: Payer: Self-pay | Admitting: *Deleted

## 2014-07-06 NOTE — Progress Notes (Signed)
Pt in nurse clinic with care provider requesting copy of medication list or to have provider discontinue the hydrocortisone 2.5 % ointment and terbinafine (LAMISIL) 1 % cream.  Will forward to PCP.  Clovis Pu, RN

## 2014-07-07 NOTE — Progress Notes (Signed)
Will provide medication list.

## 2014-07-12 ENCOUNTER — Encounter: Payer: Self-pay | Admitting: Family Medicine

## 2014-07-12 ENCOUNTER — Ambulatory Visit (INDEPENDENT_AMBULATORY_CARE_PROVIDER_SITE_OTHER): Payer: Medicare Other | Admitting: Family Medicine

## 2014-07-12 VITALS — BP 126/79 | HR 89 | Temp 98.9°F | Ht 68.0 in | Wt 169.5 lb

## 2014-07-12 DIAGNOSIS — F339 Major depressive disorder, recurrent, unspecified: Secondary | ICD-10-CM

## 2014-07-12 DIAGNOSIS — E663 Overweight: Secondary | ICD-10-CM | POA: Insufficient documentation

## 2014-07-12 DIAGNOSIS — R569 Unspecified convulsions: Secondary | ICD-10-CM

## 2014-07-12 DIAGNOSIS — E785 Hyperlipidemia, unspecified: Secondary | ICD-10-CM

## 2014-07-12 DIAGNOSIS — Z862 Personal history of diseases of the blood and blood-forming organs and certain disorders involving the immune mechanism: Secondary | ICD-10-CM

## 2014-07-12 DIAGNOSIS — Z8639 Personal history of other endocrine, nutritional and metabolic disease: Secondary | ICD-10-CM

## 2014-07-12 DIAGNOSIS — Z0000 Encounter for general adult medical examination without abnormal findings: Secondary | ICD-10-CM

## 2014-07-12 LAB — CBC WITH DIFFERENTIAL/PLATELET
BASOS PCT: 0 % (ref 0–1)
Basophils Absolute: 0 10*3/uL (ref 0.0–0.1)
EOS ABS: 0.1 10*3/uL (ref 0.0–0.7)
EOS PCT: 1 % (ref 0–5)
HCT: 44.6 % (ref 39.0–52.0)
Hemoglobin: 15.2 g/dL (ref 13.0–17.0)
Lymphocytes Relative: 25 % (ref 12–46)
Lymphs Abs: 2.2 10*3/uL (ref 0.7–4.0)
MCH: 28.6 pg (ref 26.0–34.0)
MCHC: 34.1 g/dL (ref 30.0–36.0)
MCV: 83.8 fL (ref 78.0–100.0)
MONOS PCT: 10 % (ref 3–12)
Monocytes Absolute: 0.9 10*3/uL (ref 0.1–1.0)
Neutro Abs: 5.6 10*3/uL (ref 1.7–7.7)
Neutrophils Relative %: 64 % (ref 43–77)
PLATELETS: 382 10*3/uL (ref 150–400)
RBC: 5.32 MIL/uL (ref 4.22–5.81)
RDW: 13.2 % (ref 11.5–15.5)
WBC: 8.8 10*3/uL (ref 4.0–10.5)

## 2014-07-12 LAB — POCT GLYCOSYLATED HEMOGLOBIN (HGB A1C): HEMOGLOBIN A1C: 5.4

## 2014-07-12 MED ORDER — LAMOTRIGINE 200 MG PO TABS: 200.0000 mg | ORAL_TABLET | Freq: Every day | ORAL | Status: DC

## 2014-07-12 NOTE — Assessment & Plan Note (Signed)
Currently only on Prozac. Patient's PHQ-9 score is 0. Follow-up with PCP in 3 months, although depression not managed by this office.

## 2014-07-12 NOTE — Progress Notes (Signed)
   Subjective:    Patient ID: Bobby Liu, male    DOB: Dec 13, 1965, 48 y.o.   MRN: [id number]  HPI  Patient presents for well adult visit.  Past Medical History  Diagnosis Date  . Autism   . OCD (obsessive compulsive disorder)   . Hyperlipidemia   . Seizures     Last seizure 2008. 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 Sees Dr Alvester Morin at [facility]).  . Depression    No past surgical history on file.  No family history on file.  History   Social History  . Marital Status: Single    Spouse Name: N/A    Number of Children: N/A  . Years of Education: N/A   Social History Main Topics  . Smoking status: Never Smoker   . Smokeless tobacco: Never Used  . Alcohol Use: No  . Drug Use: No  . Sexual Activity: No   Other Topics Concern  . None   Social History Narrative   Works as Public affairs consultant at [employer].  Lives in apt by himself. Parents divorced.  Sees dad every 3-4 months.  No relationship with mom.  Raised by father/maternal GM.  Grandma passed away 08-Jul-2009.  One brother in good health.  Sister in good health.      Athlete in [employer] [place]: plays soccer, basketball, floor hockey).  Member Epilepsy Adult support group.   Allergies  Allergen Reactions  . Phenobarbital     REACTION: unspecified    Review of Systems  All other systems reviewed and are negative.      Objective:  BP 126/79  Pulse 89  Temp(Src) 98.9 F (37.2 C) (Oral)  Ht  (1.727 m)  Wt 169 lb 8 oz (76.885 kg)  BMI 25.78 kg/m2  Physical Exam  Vitals reviewed. Constitutional: He appears well-developed and well-nourished.  Eyes: Conjunctivae are normal. Pupils are equal, round, and reactive to light.  Neck: Normal range of motion. Neck supple. No thyromegaly present.  Cardiovascular: Normal rate, regular rhythm and normal heart sounds.   No murmur heard. Pulmonary/Chest: Effort normal and breath sounds normal. No  respiratory distress. He has no wheezes.  Abdominal: Soft. Bowel sounds are normal. He exhibits no distension.  Musculoskeletal: Normal range of motion. He exhibits no edema and no tenderness.  Lymphadenopathy:    He has no cervical adenopathy.  Neurological: He is alert. He has normal reflexes. No cranial nerve deficit.          Assessment & Plan:

## 2014-07-12 NOTE — Assessment & Plan Note (Signed)
Recheck lipid panel. Last lipid panel with elevated LDL and total cholesterol. ASCVD risk less than 5% at that time. Will follow-up results of lipid panel. For now, diet modification discussed with patient as he mostly eats frozen dinners and fast food.

## 2014-07-12 NOTE — Patient Instructions (Addendum)
Thank you for coming to see me today. It was a pleasure. Today we talked about:   Yearly physical: I will be getting some lab work. Your last cholesterol study showed some high cholesterol. I will include some tips for heart healthy eating. Great work with the exercise!  Please make an appointment to see Dr. Jordan Likes in 3-6 months for an office visit.  If you have any questions or concerns, please do not hesitate to call the office at [phone].  Sincerely,  Bobby Hawking, MD   Fat and Cholesterol Control Diet Fat and cholesterol levels in your blood and organs are influenced by your diet. High levels of fat and cholesterol may lead to diseases of the heart, small and large blood vessels, gallbladder, liver, and pancreas. CONTROLLING FAT AND CHOLESTEROL WITH DIET Although exercise and lifestyle factors are important, your diet is key. That is because certain foods are known to raise cholesterol and others to lower it. The goal is to balance foods for their effect on cholesterol and more importantly, to replace saturated and trans fat with other types of fat, such as monounsaturated fat, polyunsaturated fat, and omega-3 fatty acids. On average, a person should consume no more than 15 to 17 g of saturated fat daily. Saturated and trans fats are considered "bad" fats, and they will raise LDL cholesterol. Saturated fats are primarily found in animal products such as meats, butter, and cream. However, that does not mean you need to give up all your favorite foods. Today, there are good tasting, low-fat, low-cholesterol substitutes for most of the things you like to eat. Choose low-fat or nonfat alternatives. Choose round or loin cuts of red meat. These types of cuts are lowest in fat and cholesterol. Chicken (without the skin), fish, veal, and ground [place] breast are great choices. Eliminate fatty meats, such as hot dogs and salami. Even shellfish have little or no saturated fat. Have a 3 oz (85 g)  portion when you eat lean meat, poultry, or fish. Trans fats are also called "partially hydrogenated oils." They are oils that have been scientifically manipulated so that they are solid at room temperature resulting in a longer shelf life and improved taste and texture of foods in which they are added. Trans fats are found in stick margarine, some tub margarines, cookies, crackers, and baked goods.  When baking and cooking, oils are a great substitute for butter. The monounsaturated oils are especially beneficial since it is believed they lower LDL and raise HDL. The oils you should avoid entirely are saturated tropical oils, such as coconut and palm.  Remember to eat a lot from food groups that are naturally free of saturated and trans fat, including fish, fruit, vegetables, beans, grains (barley, rice, couscous, bulgur wheat), and pasta (without cream sauces).  IDENTIFYING FOODS THAT LOWER FAT AND CHOLESTEROL  Soluble fiber may lower your cholesterol. This type of fiber is found in fruits such as apples, vegetables such as broccoli, potatoes, and carrots, legumes such as beans, peas, and lentils, and grains such as barley. Foods fortified with plant sterols (phytosterol) may also lower cholesterol. You should eat at least 2 g per day of these foods for a cholesterol lowering effect.  Read package labels to identify low-saturated fats, trans fat free, and low-fat foods at the supermarket. Select cheeses that have only 2 to 3 g saturated fat per ounce. Use a heart-healthy tub margarine that is free of trans fats or partially hydrogenated oil. When buying baked goods (  cookies, crackers), avoid partially hydrogenated oils. Breads and muffins should be made from whole grains (whole-wheat or whole oat flour, instead of "flour" or "enriched flour"). Buy non-creamy canned soups with reduced salt and no added fats.  FOOD PREPARATION TECHNIQUES  Never deep-fry. If you must fry, either stir-fry, which uses very  little fat, or use non-stick cooking sprays. When possible, broil, bake, or roast meats, and steam vegetables. Instead of putting butter or margarine on vegetables, use lemon and herbs, applesauce, and cinnamon (for squash and sweet potatoes). Use nonfat yogurt, salsa, and low-fat dressings for salads.  LOW-SATURATED FAT / LOW-FAT FOOD SUBSTITUTES Meats / Saturated Fat (g)  Avoid: Steak, marbled (3 oz/85 g) / 11 g  Choose: Steak, lean (3 oz/85 g) / 4 g  Avoid: Hamburger (3 oz/85 g) / 7 g  Choose: Hamburger, lean (3 oz/85 g) / 5 g  Avoid: Ham (3 oz/85 g) / 6 g  Choose: Ham, lean cut (3 oz/85 g) / 2.4 g  Avoid: Chicken, with skin, dark meat (3 oz/85 g) / 4 g  Choose: Chicken, skin removed, dark meat (3 oz/85 g) / 2 g  Avoid: Chicken, with skin, light meat (3 oz/85 g) / 2.5 g  Choose: Chicken, skin removed, light meat (3 oz/85 g) / 1 g Dairy / Saturated Fat (g)  Avoid: Whole milk (1 cup) / 5 g  Choose: Low-fat milk, 2% (1 cup) / 3 g  Choose: Low-fat milk, 1% (1 cup) / 1.5 g  Choose: Skim milk (1 cup) / 0.3 g  Avoid: Hard cheese (1 oz/28 g) / 6 g  Choose: Skim milk cheese (1 oz/28 g) / 2 to 3 g  Avoid: Cottage cheese, 4% fat (1 cup) / 6.5 g  Choose: Low-fat cottage cheese, 1% fat (1 cup) / 1.5 g  Avoid: Ice cream (1 cup) / 9 g  Choose: Sherbet (1 cup) / 2.5 g  Choose: Nonfat frozen yogurt (1 cup) / 0.3 g  Choose: Frozen fruit bar / trace  Avoid: Whipped cream (1 tbs) / 3.5 g  Choose: Nondairy whipped topping (1 tbs) / 1 g Condiments / Saturated Fat (g)  Avoid: Mayonnaise (1 tbs) / 2 g  Choose: Low-fat mayonnaise (1 tbs) / 1 g  Avoid: Butter (1 tbs) / 7 g  Choose: Extra light margarine (1 tbs) / 1 g  Avoid: Coconut oil (1 tbs) / 11.8 g  Choose: Olive oil (1 tbs) / 1.8 g  Choose: Corn oil (1 tbs) / 1.7 g  Choose: Safflower oil (1 tbs) / 1.2 g  Choose: Sunflower oil (1 tbs) / 1.4 g  Choose: Soybean oil (1 tbs) / 2.4 g  Choose: Canola oil (1 tbs) / 1  g Document Released: 10/28/2005 Document Revised: 02/22/2013 Document Reviewed: 01/26/2014 ExitCare Patient Information 2015 [place], [place]. This information is not intended to replace advice given to you by your health care provider. Make sure you discuss any questions you have with your health care provider.

## 2014-07-12 NOTE — Assessment & Plan Note (Addendum)
Eats mostly frozen dinners and fast food. Has a staff member that helps cook, clean and hygiene. Eats some fried foods at times. Exercises every day for about 30 minutes. Information given for healthier diet. Patient right at the cusp of a healthy BMI so diet changes mostly to help with high cholesterol.

## 2014-07-12 NOTE — Assessment & Plan Note (Signed)
Patient with controlled seizures. No side effects. Will follow-up LFTs, lipid profile, etc.

## 2014-07-13 ENCOUNTER — Telehealth: Payer: Self-pay | Admitting: Family Medicine

## 2014-07-13 LAB — COMPREHENSIVE METABOLIC PANEL
ALK PHOS: 71 U/L (ref 39–117)
ALT: 21 U/L (ref 0–53)
AST: 16 U/L (ref 0–37)
Albumin: 4.8 g/dL (ref 3.5–5.2)
BILIRUBIN TOTAL: 0.5 mg/dL (ref 0.2–1.2)
BUN: 10 mg/dL (ref 6–23)
CO2: 31 mEq/L (ref 19–32)
CREATININE: 1.11 mg/dL (ref 0.50–1.35)
Calcium: 10.1 mg/dL (ref 8.4–10.5)
Chloride: 99 mEq/L (ref 96–112)
GLUCOSE: 101 mg/dL — AB (ref 70–99)
Potassium: 5.1 mEq/L (ref 3.5–5.3)
SODIUM: 140 meq/L (ref 135–145)
TOTAL PROTEIN: 7.6 g/dL (ref 6.0–8.3)

## 2014-07-13 LAB — LIPID PANEL
CHOL/HDL RATIO: 7.3 ratio
Cholesterol: 264 mg/dL — ABNORMAL HIGH (ref 0–200)
HDL: 36 mg/dL — ABNORMAL LOW (ref >39)
LDL Cholesterol: 176 mg/dL — ABNORMAL HIGH (ref 0–99)
Triglycerides: 261 mg/dL — ABNORMAL HIGH (ref <150)
VLDL: 52 mg/dL — ABNORMAL HIGH (ref 0–40)

## 2014-07-13 LAB — TSH: TSH: 2.792 u[IU]/mL (ref 0.350–4.500)

## 2014-07-13 NOTE — Telephone Encounter (Signed)
Left detailed message on patients voicemail. Bobby Heinlein S  

## 2014-07-13 NOTE — Telephone Encounter (Signed)
Called patient to discuss lipid panel results. Meets criteria for moderate intensity statin therapy. Will forward info to PCP and let PCP and patient decide next course of action.

## 2014-09-14 ENCOUNTER — Other Ambulatory Visit: Payer: Self-pay | Admitting: *Deleted

## 2014-09-14 ENCOUNTER — Other Ambulatory Visit: Payer: Self-pay | Admitting: Family Medicine

## 2014-09-14 MED ORDER — FLUOXETINE HCL 20 MG PO TABS: 20.0000 mg | ORAL_TABLET | Freq: Every day | ORAL | Status: DC

## 2014-11-24 DIAGNOSIS — F840 Autistic disorder: Secondary | ICD-10-CM | POA: Diagnosis not present

## 2014-11-24 DIAGNOSIS — F42 Obsessive-compulsive disorder: Secondary | ICD-10-CM | POA: Diagnosis not present

## 2015-02-28 DIAGNOSIS — F42 Obsessive-compulsive disorder: Secondary | ICD-10-CM | POA: Diagnosis not present

## 2015-02-28 DIAGNOSIS — F840 Autistic disorder: Secondary | ICD-10-CM | POA: Diagnosis not present

## 2015-06-05 DIAGNOSIS — F840 Autistic disorder: Secondary | ICD-10-CM | POA: Diagnosis not present

## 2015-06-05 DIAGNOSIS — F42 Obsessive-compulsive disorder: Secondary | ICD-10-CM | POA: Diagnosis not present

## 2015-07-10 ENCOUNTER — Other Ambulatory Visit: Payer: Self-pay | Admitting: Family Medicine

## 2015-07-20 ENCOUNTER — Encounter: Payer: Medicare Other | Admitting: Family Medicine

## 2015-07-31 ENCOUNTER — Encounter: Payer: Self-pay | Admitting: Family Medicine

## 2015-07-31 ENCOUNTER — Ambulatory Visit (INDEPENDENT_AMBULATORY_CARE_PROVIDER_SITE_OTHER): Payer: Medicare Other | Admitting: Family Medicine

## 2015-07-31 VITALS — BP 129/82 | HR 89 | Temp 97.5°F | Ht 68.0 in | Wt 173.8 lb

## 2015-07-31 DIAGNOSIS — R21 Rash and other nonspecific skin eruption: Secondary | ICD-10-CM | POA: Diagnosis present

## 2015-07-31 DIAGNOSIS — R569 Unspecified convulsions: Secondary | ICD-10-CM | POA: Diagnosis not present

## 2015-07-31 MED ORDER — TRIAMCINOLONE ACETONIDE 0.1 % EX CREA: 1.0000 "application " | TOPICAL_CREAM | Freq: Two times a day (BID) | CUTANEOUS | Status: DC

## 2015-07-31 NOTE — Patient Instructions (Signed)
Thank you for coming in,   I would take benadryl at night to help with you not to itch while sleeping.   Apply the steroid cream   Apply eucerin or aveeno immediately after bathing.   Please bring all of your medications with you to each visit.   Sign up for My Chart to have easy access to your labs results, and communication with your Primary care physician   Please feel free to call with any questions or concerns at any time, at [phone]. --Dr. Jordan Likes

## 2015-07-31 NOTE — Assessment & Plan Note (Signed)
No problems since 1998 Has not followed up with neurology in some time.  Denies referral at this time - consider CMP upon f/u.

## 2015-07-31 NOTE — Progress Notes (Signed)
   Subjective:    Bobby Liu - 49 y.o. male MRN [id number]  Date of birth: [birth year]  HPI  Bobby Liu is here for dry skin.  Dry Skin:  Has happened before  Has applied hydrocortisone 10 OTC Has not applied any moisturizing cream  Itching most at night  Is able to get to sleep  Most occuring on his arms  Staying the same.  Has been there for two months.  No one else at home with similar symptoms  No new pets  No new soaps. Uses soap for sensitive skin (dial or dove)  No hx of eczema   Hx of seizures (unsure of type):  Was started on lamictal in 1998 and it controlled his seizures  Dr. Alvester Morin is at [facility] and was his Neurologist at the time.  He doesn't wish to pursue referral at this time.  He has been seizure fee since Sept 1998.   Health Maintenance:  Health Maintenance Due  Topic Date Due  . HIV Screening  06/25/1983    -  reports that he has never smoked. He has never used smokeless tobacco. - Review of Systems: Per HPI. - Past Medical History: Patient Active Problem List   Diagnosis Date Noted  . Overweight (BMI 25.0-29.9) 07/12/2014  . Rash and nonspecific skin eruption 11/16/2013  . AUTISM 01/16/2007  . HYPERLIPIDEMIA 01/08/2007  . DEPRESSION, MAJOR, RECURRENT 01/08/2007  . OBSESSIVE COMPUL. DISORDER 01/08/2007  . Convulsions 01/08/2007   - Medications: reviewed and updated Current Outpatient Prescriptions  Medication Sig Dispense Refill  . FLUoxetine (PROZAC) 20 MG capsule Take 20 mg by mouth daily. Patient reported medication. Prescribed by Psych doctor.    . lamoTRIgine (LAMICTAL) 200 MG tablet TAKE ONE TABLET EACH DAY 90 tablet 1  . triamcinolone cream (KENALOG) 0.1 % Apply 1 application topically 2 (two) times daily. 30 g 0   No current facility-administered medications for this visit.     Review of Systems See HPI     Objective:   Physical Exam BP 129/82 mmHg  Pulse 89  Temp(Src) 97.5 F (36.4 C) (Oral)  Ht   (1.727 m)  Wt 173 lb 12.8 oz (78.835 kg)  BMI 26.43 kg/m2 Gen: NAD, alert, cooperative with exam, CV: RRR, good S1/S2, no murmur, no edema, capillary refill brisk  Resp: CTABL, no wheezes, non-labored Skin: salmon colored papules with varying degrees of healing along his extensor surfaces of b/l forearms extending on dorsal aspect of both hands. Some cracking occuring on arms. Sporadically occurring on his chest. No involvement of his face or legs.  Neuro: no gross deficits.      Assessment & Plan:   Rash and nonspecific skin eruption Occuring on arms and chest predominately.  Possible for dry skin or xerotic eczema. Less likely psoriasis  - try applying aveeno or eucerin after bathing  - triamcinoloone 0.1%  - advised to try benadryl at night to help break the cycle of pruritis  - if no improvement in two weeks, f/u. May need to consider oral steroids and/or more potent steroid ointment.   Convulsions No problems since 1998 Has not followed up with neurology in some time.  Denies referral at this time - consider CMP upon f/u.

## 2015-07-31 NOTE — Assessment & Plan Note (Signed)
Occuring on arms and chest predominately.  Possible for dry skin or xerotic eczema. Less likely psoriasis  - try applying aveeno or eucerin after bathing  - triamcinoloone 0.1%  - advised to try benadryl at night to help break the cycle of pruritis  - if no improvement in two weeks, f/u. May need to consider oral steroids and/or more potent steroid ointment.

## 2015-08-21 ENCOUNTER — Telehealth: Payer: Self-pay | Admitting: Family Medicine

## 2015-08-21 NOTE — Telephone Encounter (Signed)
Pt and caregiver brought in forms to be completed for [employer].  Will pick up when ready

## 2015-08-22 NOTE — Telephone Encounter (Signed)
Forms placed in PCP box. Zimmerman Rumple, April D, CMA  

## 2015-08-23 DIAGNOSIS — F840 Autistic disorder: Secondary | ICD-10-CM | POA: Diagnosis not present

## 2015-08-28 NOTE — Telephone Encounter (Signed)
Forms completed.   Bobby RudeJeremy E Smrithi Pigford, MD PGY-3, [facility] 08/28/2015, 4:46 PM

## 2015-08-30 NOTE — Telephone Encounter (Signed)
Called LM informing pt of the below information. Sunday SpillersSharon T Saunders, CMA

## 2015-11-22 DIAGNOSIS — F422 Mixed obsessional thoughts and acts: Secondary | ICD-10-CM | POA: Diagnosis not present

## 2015-11-23 ENCOUNTER — Encounter: Payer: Self-pay | Admitting: Student

## 2015-11-23 ENCOUNTER — Telehealth: Payer: Self-pay | Admitting: Family Medicine

## 2015-11-23 ENCOUNTER — Ambulatory Visit (INDEPENDENT_AMBULATORY_CARE_PROVIDER_SITE_OTHER): Payer: Medicare Other | Admitting: Student

## 2015-11-23 VITALS — BP 102/70 | HR 103 | Temp 97.7°F | Ht 68.0 in | Wt 170.0 lb

## 2015-11-23 DIAGNOSIS — R21 Rash and other nonspecific skin eruption: Secondary | ICD-10-CM | POA: Diagnosis present

## 2015-11-23 MED ORDER — TRIAMCINOLONE ACETONIDE 0.1 % EX CREA: 1.0000 "application " | TOPICAL_CREAM | Freq: Two times a day (BID) | CUTANEOUS | Status: DC

## 2015-11-23 NOTE — Telephone Encounter (Signed)
Will forward to Dr. Haney. Jazmin Hartsell,CMA  

## 2015-11-23 NOTE — Telephone Encounter (Signed)
Mr. Sharlet SalinaBenjamin, the pt's caregiver, is concerned about the pt's visit today as the pt was seen before the caregiver could return. Mr. Sharlet SalinaBenjamin would like to speak with the provider that seen the pt today regarding the pt's symptoms. Please contact Mr. Sharlet SalinaBenjamin at the earliest convenience. Sending to [facility] Team for Dr. [name], the provider that seen pt today. Thank you, Dorothey BasemanSadie Reynolds, ASA

## 2015-11-23 NOTE — Patient Instructions (Addendum)
Follow up with PCP as needed You were prescribed Triamcinolone cream for likely eczema on your arms You also thought you have bed bugs. Please wash bed sheets and clothes in hot water. Change mattress if possible Call the office at [phone] with questions or concerns

## 2015-11-23 NOTE — Progress Notes (Signed)
   Subjective:    Patient ID: Bobby Liu, male    DOB: Oct 07, 1966, 50 y.o.   MRN: [id number]   CC: Rash on arms  HPI: 50 y/o with history of eczema on arms treated with  triamcinolone cream, presents for rash but also concern for bed bug  Rash - Rash on both arms which he feels is stable at his baseline rash - he has been using triamcinolone cream which he feels does help, but ran out this AM and would like more - his rash was itchy yesterday but this has improved  Concern for bed bugs - 2 days ago he saw bed bugs in his bed and on hm - he denies rash any other place than his arms, which is at his baseline rash - he is considering getting a new mattress, but has yet to do anything else for the bed bugs   Review of Systems ROS  Denies recent illness, fevers, chest pain, SOB, N/V/D, changes in vision, headache  Past Medical, Surgical, Social, and Family History Reviewed & Updated per EMR.   Objective:  BP 102/70 mmHg  Pulse 103  Temp(Src) 97.7 F (36.5 C) (Oral)  Ht 5\' 8"  (1.727 m)  Wt 170 lb (77.111 kg)  BMI 25.85 kg/m2  SpO2 98% Vitals and nursing note reviewed  General: NAD Cardiac: RRR Respiratory: CTAB, normal effort. Skin: bilateral erythematous, dry, scaling rash over bilateral upper arms, most on the fore arms with some extension to the upper arms and upper back. Some erythematous papular lesions over bilateral shoulders otherwise warm and dry, no other rashes noted Neuro: alert and oriented, no focal deficits   Assessment & Plan:    Rash and nonspecific skin eruption Rash mostly on arms with some extension to upper back, scaling and dry nature consistent with eczema. It has responded to steroid cream in the past also making eczema more likely. Less likely psoriasis given lack of plaque like presentation or fungal infection given response to steroids. There are some group erythematous papules which may be consistent with bed bug bites - Will refill  triamcinolone cream for rash - pt counseled to wash all bed linens and clothes in hot water and proceed with mattress changes of possible - Will follow with PCP as needed     Emmerson Taddei A. [name] MD, MS Family Medicine Resident PGY-2 Pager [phone]

## 2015-11-23 NOTE — Assessment & Plan Note (Signed)
Rash mostly on arms with some extension to upper back, scaling and dry nature consistent with eczema. It has responded to steroid cream in the past also making eczema more likely. Less likely psoriasis given lack of plaque like presentation or fungal infection given response to steroids. There are some group erythematous papules which may be consistent with bed bug bites - Will refill triamcinolone cream for rash - pt counseled to wash all bed linens and clothes in hot water and proceed with mattress changes of possible - Will follow with PCP as needed

## 2015-11-24 ENCOUNTER — Telehealth: Payer: Self-pay | Admitting: Student

## 2015-11-24 NOTE — Telephone Encounter (Signed)
Pt care giver called back regarding office visit. All questions were answered to his satisfaction  [name] A. [name] MD, MS Family Medicine Resident PGY-2 Pager [phone]

## 2015-11-24 NOTE — Telephone Encounter (Signed)
Mr. Bobby Liu is returning our call. jw

## 2015-12-19 DIAGNOSIS — H1013 Acute atopic conjunctivitis, bilateral: Secondary | ICD-10-CM | POA: Diagnosis not present

## 2015-12-19 DIAGNOSIS — H40033 Anatomical narrow angle, bilateral: Secondary | ICD-10-CM | POA: Diagnosis not present

## 2016-01-08 ENCOUNTER — Other Ambulatory Visit: Payer: Self-pay | Admitting: Family Medicine

## 2016-03-14 DIAGNOSIS — F422 Mixed obsessional thoughts and acts: Secondary | ICD-10-CM | POA: Diagnosis not present

## 2016-05-01 DIAGNOSIS — F840 Autistic disorder: Secondary | ICD-10-CM | POA: Diagnosis not present

## 2016-05-09 DIAGNOSIS — F422 Mixed obsessional thoughts and acts: Secondary | ICD-10-CM | POA: Diagnosis not present

## 2016-07-11 DIAGNOSIS — F422 Mixed obsessional thoughts and acts: Secondary | ICD-10-CM | POA: Diagnosis not present

## 2016-07-12 ENCOUNTER — Other Ambulatory Visit: Payer: Self-pay | Admitting: Family Medicine

## 2016-07-12 NOTE — Telephone Encounter (Signed)
Refilled prescription for Lamictal. Called patient to discuss medication as he has not been seen by PCP in a year. His caretaker answered the phone. Patient has a caretaker due to autism. Discussed the need to see patient for an appointment for his seizure medication. Scheduled appointment for 9/13.

## 2016-07-24 ENCOUNTER — Ambulatory Visit (INDEPENDENT_AMBULATORY_CARE_PROVIDER_SITE_OTHER): Payer: Medicare Other | Admitting: Family Medicine

## 2016-07-24 ENCOUNTER — Encounter: Payer: Self-pay | Admitting: Family Medicine

## 2016-07-24 DIAGNOSIS — Z8669 Personal history of other diseases of the nervous system and sense organs: Secondary | ICD-10-CM | POA: Diagnosis present

## 2016-07-24 DIAGNOSIS — Z87898 Personal history of other specified conditions: Secondary | ICD-10-CM

## 2016-07-24 NOTE — Assessment & Plan Note (Addendum)
Has been on Lamictal since 1998. Neurological exam normal today. Patient unsure if he had a seizure last month as he lost consciousness for a minute while he was on the toilet, from his description it sounds more like a vasovagal episode. [facility] but is interested in seeing someone more local in [place]. -We'll continue Lamictal 200 mg daily for now -Patient agreeable to see neurology, will place a referral -Discussed that if he starts having any seizure episodes to call the clinic to be seen -All questions from him and his caretaker were answered today

## 2016-07-24 NOTE — Patient Instructions (Signed)
Thank you for coming in today, it was so nice to see you! Today we talked about:    Seizure medication: Continue taking your seizure medication daily. I have placed a referral for a neurologist. Our office will call you to schedule that appointment.  Please follow up in 1 year or sooner if needed. Please let us know if you have any seizure episodes of episodes of when you are passing out.   Bring in all your medications or supplements to each appointment for review.   If you have any questions or concerns, please do not hesitate to call the office at [phone]. You can also message me directly via MyChart.   Sincerely,  Anders Simmondshristina Tacy Chavis, MD

## 2016-07-24 NOTE — Progress Notes (Signed)
   Subjective:    Patient ID: Bobby Liu , male   DOB: [birth year] , 50 y.o..   MRN: [id number]  HPI  Bobby Liu is here for follow up.  Seizures: Mr. Bobby Liu is here today with his caretaker, Bobby Liu. Patient has a history of seizures, he is unsure which type of seizures he has had or if he was ever diagnosed with epilepsy. Patient has been on Lamictal since 1998. Has not had a seizure since earlier this month. However, he was unsure if the episode was actually a seizure because he passed out for a brief minute when he was on the toilet. Denied any incontinence, tongue biting, or post ictal state. He has not seen neurology in "years", he was [facility]. With previous PCPs he has stated that he was not interested in seeing a neurologist and today he is interested in seeing neurology.  Review of Systems: Per HPI. All other systems reviewed and are negative.  Past Medical History: Patient Active Problem List   Diagnosis Date Noted  . History of seizure 07/24/2016  . Overweight (BMI 25.0-29.9) 07/12/2014  . Rash and nonspecific skin eruption 11/16/2013  . AUTISM 01/16/2007  . HYPERLIPIDEMIA 01/08/2007  . DEPRESSION, MAJOR, RECURRENT 01/08/2007  . OBSESSIVE COMPUL. DISORDER 01/08/2007  . Convulsions (HCC) 01/08/2007    Medications: reviewed and updated Current Outpatient Prescriptions  Medication Sig Dispense Refill  . FLUoxetine (PROZAC) 20 MG capsule Take 20 mg by mouth daily. Patient reported medication. Prescribed by Psych doctor.    . lamoTRIgine (LAMICTAL) 200 MG tablet TAKE ONE TABLET EACH DAY 90 tablet 0  . triamcinolone cream (KENALOG) 0.1 % Apply 1 application topically 2 (two) times daily. (Patient not taking: Reported on 07/24/2016) 30 g 3   No current facility-administered medications for this visit.     Social Hx:  reports that he has never smoked. He has never used smokeless tobacco.    Objective:   BP 127/66    Pulse 84   Temp 97.9 F (36.6 C) (Oral)   Ht 5\' 8"  (1.727 m)   Wt 168 lb 12.8 oz (76.6 kg)   BMI 25.67 kg/m  Physical Exam  Gen: NAD, alert, cooperative with exam, well-appearing HEENT: NCAT, PERRL, clear conjunctiva, oropharynx clear, supple neck Cardiac: Regular rate and rhythm, normal S1/S2, no murmur, no edema, capillary refill brisk  Respiratory: Clear to auscultation bilaterally, no wheezes, non-labored breathing Gastrointestinal: soft, non tender, non distended, bowel sounds present Skin: no rashes, normal turgor  Neurological: Alert and oriented x 3, CN 2-12 intact, 5/5 strength in bilateral upper and lower extremities, 2+ patellar reflex bilaterally, sensation grossly intact throughout, normal gait Psych: good insight, normal mood and affect   Assessment & Plan:  History of seizure Has been on Lamictal since 1998. Patient unsure if he had a seizure last month as he lost consciousness for a minute while he was on the toilet, from his description it sounds more like a vasovagal episode. [facility] but is interested in seeing someone [place] in [place]. -We'll continue Lamictal 200 mg daily for now -Patient agreeable to see neurology, will place a referral -Discussed that if he starts having any seizure episodes to call the clinic to be seen -All questions from him and his caretaker were answered today   Anders Simmondshristina Gambino, MD [facility], PGY-2

## 2016-08-01 ENCOUNTER — Ambulatory Visit (INDEPENDENT_AMBULATORY_CARE_PROVIDER_SITE_OTHER): Payer: Medicare Other | Admitting: Neurology

## 2016-08-01 ENCOUNTER — Telehealth: Payer: Self-pay | Admitting: Neurology

## 2016-08-01 ENCOUNTER — Encounter: Payer: Self-pay | Admitting: Neurology

## 2016-08-01 ENCOUNTER — Telehealth: Payer: Self-pay | Admitting: Family Medicine

## 2016-08-01 VITALS — BP 124/82 | HR 72 | Temp 97.8°F | Ht 68.0 in | Wt 170.1 lb

## 2016-08-01 DIAGNOSIS — G40309 Generalized idiopathic epilepsy and epileptic syndromes, not intractable, without status epilepticus: Secondary | ICD-10-CM | POA: Diagnosis not present

## 2016-08-01 DIAGNOSIS — R55 Syncope and collapse: Secondary | ICD-10-CM

## 2016-08-01 NOTE — Patient Instructions (Addendum)
1. Schedule 1-hour sleep-deprived EEG 2. Schedule MRI brain with and without contrast seizure protocol 3. Schedule EKG through PCP office 4. [facility] will [facility] 5. Continue Lamotrigine 200mg  daily 6. Follow-up in 3 months, call for any changes  Seizure Precautions: 1. If medication has been prescribed for you to prevent seizures, take it exactly as directed.  Do not stop taking the medicine without talking to your doctor first, even if you have not had a seizure in a long time.   2. Avoid activities in which a seizure would cause danger to yourself or to others.  Don't operate dangerous machinery, swim alone, or climb in high or dangerous places, such as on ladders, roofs, or girders.  Do not drive unless your doctor says you may.  3. If you have any warning that you may have a seizure, lay down in a safe place where you can't hurt yourself.    4.  No driving for 6 months from last seizure, as per [facility] [place] state law.   Please refer to the following link on the Epilepsy Foundation of America's website for more information: http://www.epilepsyfoundation.org/answerplace/Social/driving/drivingu.cfm   5.  Maintain good sleep hygiene. Avoid alcohol.  6.  Contact your doctor if you have any problems that may be related to the medicine you are taking.  7.  Call 911 and bring the patient back to the ED if:        A.  The seizure lasts longer than 5 minutes.       B.  The patient doesn't awaken shortly after the seizure  C.  The patient has new problems such as difficulty seeing, speaking or moving  D.  The patient was injured during the seizure  E.  The patient has a temperature over 102 F (39C)  F.  The patient vomited and now is having trouble breathing

## 2016-08-01 NOTE — Progress Notes (Signed)
NEUROLOGY CONSULTATION NOTE  Bobby Liu MRN: [id number] DOB: [birth year]  Referring provider: Dr. Anders Simmonds Primary care provider: Dr. Anders Simmonds  Reason for consult:  Establish care for seizures  Dear Dr Bobby Liu:  Thank you for your kind referral of Bobby Liu for consultation of the above symptoms. Although his history is well known to you, please allow me to reiterate it for the purpose of our medical record. Records and images were personally reviewed where available.  HISTORY OF PRESENT ILLNESS: This is a 50 year old right-handed man with a history of seizures and autism, presenting to establish care. He started having seizures at age 48, he recalls he had a convulsion in the middle of the night Christmas 1980 and woke up in the ambulance. He was diagnosed with epilepsy and was having 2-4 seizures a day. He recalls taking Tegretol and Neurontin in the past, which were ineffective. He was started on Lamotrigine in March 1998, initially taking it twice a day, then reduced to Lamotrigine 200mg  once a day dosing for possibly the past 5 years or so. He denies any seizures since 08/09/1997 when he apparently had 8 seizures in one day. There is note on EPIC of last seizure in 2008. He denies any side effects on the medication. He reports that he sometimes would feels strange or dizzy before a seizure then wakes up on the floor. He denies ever biting his tongue or having incontinence. He used to see Dr. Alvester Morin and Frederica Kuster at [facility], and has not seen a neurologist in many years. He presents today due to an episode late August/early September while urinating standing up, he then woke up inside the bathtub with his clothes on. He reports things happened so fast, he did not feel dizzy or strange, and felt back to himself when he came to. He denies any staring/unresponsive episodes, gaps in time, olfactory/gustatory hallucinations, deja vu, rising epigastric sensation,  focal numbness/tingling/weakness, myoclonic jerks. He has occasional headaches, denies any dizziness, diplopia, dysarthria,dysphagia, neck/back pain, bowel/bladder dysfunction. He lives in an AFL home, finished 12th grade with grades in the As in special education classes.   Epilepsy Risk Factors:  Mild cognitive impairment/autism. He had a normal birth and early development.  There is no history of febrile convulsions, CNS infections such as meningitis/encephalitis, significant traumatic brain injury, neurosurgical procedures, or family history of seizures.  Prior AEDs: Tegretol, Neurontin No EEGs or MRI available for review.  PAST MEDICAL HISTORY: Past Medical History:  Diagnosis Date  . Autism   . Depression   . Hyperlipidemia   . OCD (obsessive compulsive disorder)   . Seizures (HCC)    Last seizure 2008. 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 Se[facility] Dr Alvester Morin at [facility]    PAST SURGICAL HISTORY: No past surgical history on file.  MEDICATIONS: Current Outpatient Prescriptions on File Prior to Visit  Medication Sig Dispense Refill  . FLUoxetine (PROZAC) 20 MG capsule Take 20 mg by mouth daily. Patient reported medication. Prescribed by Psych doctor.    . lamoTRIgine (LAMICTAL) 200 MG tablet TAKE ONE TABLET EACH DAY 90 tablet 0  . triamcinolone cream (KENALOG) 0.1 % Apply 1 application topically 2 (two) times daily. (Patient not taking: Reported on 07/24/2016) 30 g 3   No current facility-administered medications on file prior to visit.     ALLERGIES: Allergies  Allergen Reactions  . Phenobarbital     REACTION: unspecified    FAMILY HISTORY: No  family history on file.  SOCIAL HISTORY: Social History   Social History  . Marital status: Single    Spouse name: N/A  . Number of children: N/A  . Years of education: N/A   Occupational History  . Not on file.   Social History Main Topics  . Smoking status: Never Smoker  .  Smokeless tobacco: Never Used  . Alcohol use No  . Drug use: No  . Sexual activity: No   Other Topics Concern  . 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 Parents divorced.  Sees dad every 3-4 months.  No relationship with mom.  Raised by father/maternal GM.  Grandma passed away 06-13-09.  One brother in good health.  Sister in good health.      Athlete in [employer] ([place] [place] team: plays soccer, basketball, floor hockey).  Member Epilepsy Adult support group.    REVIEW OF SYSTEMS: Constitutional: No fevers, chills, or sweats, no generalized fatigue, change in appetite Eyes: No visual changes, double vision, eye pain Ear, nose and throat: No hearing loss, ear pain, nasal congestion, sore throat Cardiovascular: No chest pain, palpitations Respiratory:  No shortness of breath at rest or with exertion, wheezes GastrointestinaI: No nausea, vomiting, diarrhea, abdominal pain, fecal incontinence Genitourinary:  No dysuria, urinary retention or frequency Musculoskeletal:  No neck pain, back pain Integumentary: No rash, pruritus, skin lesions Neurological: as above Psychiatric: No depression, insomnia, anxiety Endocrine: No palpitations, fatigue, diaphoresis, mood swings, change in appetite, change in weight, increased thirst Hematologic/Lymphatic:  No anemia, purpura, petechiae. Allergic/Immunologic: no itchy/runny eyes, nasal congestion, recent allergic reactions, rashes  PHYSICAL EXAM: Vitals:   08/01/16 0927  BP: 124/82  Pulse: 72  Temp: 97.8 F (36.6 C)   General: No acute distress, poor eye contact Head:  Normocephalic/atraumatic Eyes: Fundoscopic exam shows bilateral sharp discs, no vessel changes, exudates, or hemorrhages Neck: supple, no paraspinal tenderness, full range of motion Back: No paraspinal tenderness Heart: regular rate and rhythm Lungs: Clear to auscultation bilaterally. Vascular: No  carotid bruits. Skin/Extremities: No rash, no edema Neurological Exam: Mental status: alert and oriented to person, place, and time, no dysarthria or aphasia, Fund of knowledge is appropriate.  Recent and remote memory are intact.  Attention and concentration are normal.    Able to name objects and repeat phrases. Cranial nerves: CN I: not tested CN II: pupils equal, round and reactive to light, visual fields intact, fundi unremarkable. CN III, IV, VI:  full range of motion, no nystagmus, no ptosis CN V: facial sensation intact CN VII: upper and lower face symmetric CN VIII: hearing intact to finger rub CN IX, X: gag intact, uvula midline CN XI: sternocleidomastoid and trapezius muscles intact CN XII: tongue midline Bulk & Tone: normal, no fasciculations. Motor: 5/5 throughout with no pronator drift. Sensation: intact to light touch, cold, pin, vibration and joint position sense.  No extinction to double simultaneous stimulation.  Romberg test negative Deep Tendon Reflexes: +2 throughout, no ankle clonus Plantar responses: downgoing bilaterally Cerebellar: no incoordination on finger to nose, heel to shin. No dysdiadochokinesia Gait: narrow-based and steady, able to tandem walk adequately. Tremor: none  IMPRESSION: This is a 50 year old right-handed man with a history of autism and seizures since age 51. He reports generalized convulsions without any significant warning except for feeling dizzy or strange. There is a diagnosis on EPIC of complex partial seizures that secondarily generalize. No  prior EEGs or MRIs available for review. He reports last seizure was in 1998, but there is a note of last seizure in 2008 on EPIC. He reports an unwitnessed syncopal episode while urinating at the beginning of the month, unclear if seizure versus vasovagal syncope. MRI brain with and without contrast and a 1-hour EEG will be ordered. EKG will be scheduled through his PCP. Continue Lamotrigine 200mg   daily for now. He does not drive. He will follow-up in 3 months and knows to call for any changes.   Thank you for allowing me to participate in the care of this patient. Please do not hesitate to call for any questions or concerns.   Patrcia DollyKaren Aquino, M.D.  CC: Dr. Jonathon JordanGambino

## 2016-08-01 NOTE — Telephone Encounter (Signed)
Patient seen Dr. Karel JarvisAquino (neurology) today and Dr. Karel JarvisAquino would like an EKG done on patient. Nurse did not know the specific reason why since progress note was not complete. If any question please call [facility]. [phone].

## 2016-08-01 NOTE — Telephone Encounter (Signed)
Dr. Jonathon JordanGambino,  You can see the office note in EPIC, doesn't look like it is complete. Sunday SpillersSharon T Dechelle Attaway, CMA

## 2016-08-01 NOTE — Telephone Encounter (Signed)
Darl PikesSusan, please call Alla Germanonald Benjamin        Patient Contacts Alla Germanonald Benjamin      With instructions on how long the patient has to stay awake for this testing.  He needs all the instructions as he will be the one bringing him.

## 2016-08-05 NOTE — Telephone Encounter (Signed)
Will wait for note to be completed for further recommendations. I assume this isn't an urgent EKG otherwise patient will need to go to ED or urgent care. Can certainly do EKG on patient in office. Appreciate neurology's input.

## 2016-08-08 ENCOUNTER — Ambulatory Visit (INDEPENDENT_AMBULATORY_CARE_PROVIDER_SITE_OTHER): Payer: Medicare Other | Admitting: Neurology

## 2016-08-08 DIAGNOSIS — G40309 Generalized idiopathic epilepsy and epileptic syndromes, not intractable, without status epilepticus: Secondary | ICD-10-CM | POA: Diagnosis not present

## 2016-08-08 DIAGNOSIS — R55 Syncope and collapse: Secondary | ICD-10-CM

## 2016-08-09 ENCOUNTER — Encounter: Payer: Self-pay | Admitting: Neurology

## 2016-08-10 NOTE — Procedures (Signed)
ELECTROENCEPHALOGRAM REPORT  Date of Study: 08/08/2016  Patient's Name: Bobby Liu MRN: [id number] Date of Birth: 10-09-1966  Referring Provider: Dr. Patrcia DollyKaren Aquino  Clinical History: This is a 50 year old man with a history of seizures, seizure-free for many years, with a recent episode of loss of consciousness.  Medications: Lamictal, Prozac  Technical Summary: A multichannel digital 1-hour sleep-deprived EEG recording measured by the international 10-20 system with electrodes applied with paste and impedances below 5000 ohms performed in our laboratory with EKG monitoring in an awake and asleep patient.  Hyperventilation and photic stimulation were performed.  The digital EEG was referentially recorded, reformatted, and digitally filtered in a variety of bipolar and referential montages for optimal display.    Description: The patient is awake and asleep during the recording.  During maximal wakefulness, there is a symmetric, medium voltage 9 Hz posterior dominant rhythm that attenuates with eye opening.  The record is symmetric.  During drowsiness and sleep, there is an increase in theta slowing of the background.  Vertex waves and symmetric sleep spindles were seen.  Hyperventilation and photic stimulation did not elicit any abnormalities.  There were no epileptiform discharges or electrographic seizures seen.    EKG lead was unremarkable.  Impression: This 1-hour awake and asleep EEG is normal.    Clinical Correlation: A normal EEG does not exclude a clinical diagnosis of epilepsy.  If further clinical questions remain, prolonged EEG may be helpful.  Clinical correlation is advised.   Patrcia DollyKaren Aquino, M.D.

## 2016-08-13 ENCOUNTER — Telehealth: Payer: Self-pay | Admitting: *Deleted

## 2016-08-13 NOTE — Telephone Encounter (Signed)
Physical form dropped off for at front desk for completion.  Verified that patient section of form has been completed.  Last DOS/WCC with PCP was 07/24/16.  Placed form in team folder to be completed by clinical staff.  Lamonte SakaiZimmerman Rumple, April D, [place]

## 2016-08-14 NOTE — Telephone Encounter (Signed)
Clinical info completed on physical / FL2 form.  Place form in Dr. Pennie RushingGambino's box for completion.  Sunday SpillersSharon T Saunders, CMA

## 2016-09-02 NOTE — Telephone Encounter (Signed)
Reviewed, completed, and signed form.  Note routed to RN team inbasket and placed completed form in Clinic RN's office (wall pocket above desk).  Ranesha Val M Marisal Swarey, MD   

## 2016-09-02 NOTE — Telephone Encounter (Signed)
Patient's caregiver informed that FL2 forms are complete and ready for pick up.  Clovis PuMartin, Deniesha Stenglein L, RN

## 2016-09-26 DIAGNOSIS — F840 Autistic disorder: Secondary | ICD-10-CM | POA: Diagnosis not present

## 2016-09-26 DIAGNOSIS — F422 Mixed obsessional thoughts and acts: Secondary | ICD-10-CM | POA: Diagnosis not present

## 2016-09-27 ENCOUNTER — Ambulatory Visit
Admission: RE | Admit: 2016-09-27 | Discharge: 2016-09-27 | Disposition: A | Discharge status: Home / Self Care | Payer: Medicare Other | Source: Ambulatory Visit | Attending: Neurology | Admitting: Neurology

## 2016-09-27 DIAGNOSIS — R55 Syncope and collapse: Secondary | ICD-10-CM

## 2016-09-27 DIAGNOSIS — G40909 Epilepsy, unspecified, not intractable, without status epilepticus: Secondary | ICD-10-CM | POA: Diagnosis not present

## 2016-09-27 DIAGNOSIS — G40309 Generalized idiopathic epilepsy and epileptic syndromes, not intractable, without status epilepticus: Secondary | ICD-10-CM

## 2016-09-27 MED ORDER — GADOBENATE DIMEGLUMINE 529 MG/ML IV SOLN
16.0000 mL | Freq: Once | INTRAVENOUS | Status: AC | PRN
  Administered 2016-09-27: 16 mL via INTRAVENOUS

## 2016-10-01 ENCOUNTER — Other Ambulatory Visit: Payer: Self-pay | Admitting: Family Medicine

## 2016-10-01 ENCOUNTER — Telehealth: Payer: Self-pay

## 2016-10-01 NOTE — Telephone Encounter (Signed)
-----   Message from Van ClinesKaren M Aquino, MD sent at 10/01/2016 10:03 AM EST ----- Pls let him know I reviewed the MRI brain and it is normal, no evidence of tumor, stroke, or bleed, thanks

## 2016-10-01 NOTE — Telephone Encounter (Signed)
Patient's provider notified.

## 2016-11-07 ENCOUNTER — Ambulatory Visit: Payer: Medicare Other | Admitting: Neurology

## 2016-11-15 ENCOUNTER — Encounter: Payer: Self-pay | Admitting: Neurology

## 2016-11-15 ENCOUNTER — Ambulatory Visit (INDEPENDENT_AMBULATORY_CARE_PROVIDER_SITE_OTHER): Payer: Medicare Other | Admitting: Neurology

## 2016-11-15 VITALS — BP 122/78 | HR 95 | Ht 68.0 in | Wt 176.0 lb

## 2016-11-15 DIAGNOSIS — R55 Syncope and collapse: Secondary | ICD-10-CM | POA: Diagnosis not present

## 2016-11-15 DIAGNOSIS — G40309 Generalized idiopathic epilepsy and epileptic syndromes, not intractable, without status epilepticus: Secondary | ICD-10-CM | POA: Diagnosis not present

## 2016-11-15 NOTE — Progress Notes (Signed)
NEUROLOGY FOLLOW UP OFFICE NOTE  Bobby Liu [id number]  HISTORY OF PRESENT ILLNESS: I had the pleasure of seeing Bobby Liu in follow-up in the neurology clinic on 11/15/2016.  The patient was last seen 3 months ago for seizures and a recent episode of syncope during urination in late August/early September. Records and images were personally reviewed where available.  I personally reviewed MRI brain with and without contrast which was normal, hippocampi symmetric with no abnormal signal or enhancement seen. His 1-hour sleep-deprived EEG was normal. He denies any further episodes of loss of consciousness, no seizure-like symptoms. He is taking Lamotrigine 200mg  daily with no side effects. He denies any headaches, dizziness, diplopia, focal numbness/tingling/weakness, no falls.  HPI: This is a 51 yo RH man with a history of seizures and autism. He started having seizures at age 51, he recalls he had a convulsion in the middle of the night Christmas 1980 and woke up in the ambulance. He was diagnosed with epilepsy and was having 2-4 seizures a day. He recalls taking Tegretol and Neurontin in the past, which were ineffective. He was started on Lamotrigine in March 1998, initially taking it twice a day, then reduced to Lamotrigine 200mg  once a day dosing for possibly the past 5 years or so. He denies any seizures since 08/09/1997 when he apparently had 8 seizures in one day. There is note on EPIC of last seizure in 2008. He denies any side effects on the medication. He reports that he sometimes would feels strange or dizzy before a seizure then wakes up on the floor. He denies ever biting his tongue or having incontinence. He used to see [facility]vester [facility]inBell and [facility], and has not seen a neurologist in many years. He presents today due to an episode late August/early September while urinating standing up, he then woke up inside the bathtub with his clothes on. He reports things happened so  fast, he did not feel dizzy or strange, and felt back to himself when he came to. He denies any staring/unresponsive episodes, gaps in time, olfactory/gustatory hallucinations, deja vu, rising epigastric sensation, focal numbness/tingling/weakness, myoclonic jerks. He has occasional headaches, denies any dizziness, diplopia, dysarthria,dysphagia, neck/back pain, bowel/bladder dysfunction. He lives in an AFL home, finished 12th grade with grades in the As in special education classes.   Epilepsy Risk Factors:  Mild cognitive impairment/autism. He had a normal birth and early development.  There is no history of febrile convulsions, CNS infections such as meningitis/encephalitis, significant traumatic brain injury, neurosurgical procedures, or family history of seizures.  Prior AEDs: Tegretol, Neurontin No EEGs or MRI available for review.  PAST MEDICAL HISTORY: Past Medical History:  Diagnosis Date  . Autism   . Depression   . Hyperlipidemia   . OCD (obsessive compulsive disorder)   . Seizures (HCC)    Last seizure 2008. 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 [facility]    MEDICATIONS: Current Outpatient Prescriptions on File Prior to Visit  Medication Sig Dispense Refill  . FLUoxetine (PROZAC) 20 MG capsule Take 20 mg by mouth daily. Patient reported medication. Prescribed by Psych doctor.    . lamoTRIgine (LAMICTAL) 200 MG tablet TAKE ONE TABLET EACH DAY 90 tablet 0  . triamcinolone cream (KENALOG) 0.1 % Apply 1 application topically 2 (two) times daily. (Patient not taking: Reported on 08/01/2016) 30 g 3   No current facility-administered medications on file prior to visit.  ALLERGIES: Allergies  Allergen Reactions  . Phenobarbital     REACTION: unspecified    FAMILY HISTORY: No family history on file.  SOCIAL HISTORY: Social History   Social History  . Marital status: Single    Spouse name: N/A  . Number  of children: N/A  . Years of education: N/A   Occupational History  . Not on file.   Social History Main Topics  . Smoking status: Never Smoker  . Smokeless tobacco: Never Used  . Alcohol use No  . Drug use: No  . Sexual activity: No   Other Topics Concern  . 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 Parents divorced.  Sees dad every 3-4 months.  No relationship with mom.  Raised by father/maternal GM.  Grandma passed away 05-21-09.  One brother in good health.  Sister in good health.      Athlete in [employer] ([employer] [place] team: plays soccer, basketball, floor hockey).  Member Epilepsy Adult support group.    REVIEW OF SYSTEMS: Constitutional: No fevers, chills, or sweats, no generalized fatigue, change in appetite Eyes: No visual changes, double vision, eye pain Ear, nose and throat: No hearing loss, ear pain, nasal congestion, sore throat Cardiovascular: No chest pain, palpitations Respiratory:  No shortness of breath at rest or with exertion, wheezes GastrointestinaI: No nausea, vomiting, diarrhea, abdominal pain, fecal incontinence Genitourinary:  No dysuria, urinary retention or frequency Musculoskeletal:  No neck pain, back pain Integumentary: No rash, pruritus, skin lesions Neurological: as above Psychiatric: No depression, insomnia, anxiety Endocrine: No palpitations, fatigue, diaphoresis, mood swings, change in appetite, change in weight, increased thirst Hematologic/Lymphatic:  No anemia, purpura, petechiae. Allergic/Immunologic: no itchy/runny eyes, nasal congestion, recent allergic reactions, rashes  PHYSICAL EXAM: Vitals:   11/15/16 0846  BP: 122/78  Pulse: 95   General: No acute distress Head:  Normocephalic/atraumatic Neck: supple, no paraspinal tenderness, full range of motion Heart:  Regular rate and rhythm Lungs:  Clear to auscultation bilaterally Back: No paraspinal  tenderness Skin/Extremities: No rash, no edema Neurological Exam: alert and oriented to person, place, and time. No aphasia or dysarthria. Fund of knowledge is appropriate.  Recent and remote memory are intact.  Attention and concentration are normal.    Able to name objects and repeat phrases. Cranial nerves: Pupils equal, round, reactive to light. Extraocular movements intact with no nystagmus. Visual fields full. Facial sensation intact. No facial asymmetry. Tongue, uvula, palate midline.  Motor: Bulk and tone normal, muscle strength 5/5 throughout with no pronator drift.  Sensation to light touch intact.  No extinction to double simultaneous stimulation.  Deep tendon reflexes 2+ throughout, toes downgoing.  Finger to nose testing intact.  Gait narrow-based and steady, able to tandem walk adequately.  Romberg negative.  IMPRESSION: This is a 51 yo RH man with a history of autism and seizures since age 66. He reports generalized convulsions without any significant warning except for feeling dizzy or strange. There is a diagnosis on EPIC of complex partial seizures that secondarily generalize. He reports last seizure was in 1998, but there is a note of last seizure in 2008 on EPIC. He reports an unwitnessed syncopal episode while urinating last September, unclear if seizure versus vasovagal syncope. MRI brain with and without contrast and 1-hour EEG normal. He has not yet done the EKG with his PCP. We discussed that no medication changes will be made for now, but if  he has another syncopal episode, he knows to call our office. He does not drive. He will follow-up in 6 months and knows to call for any changes.   Thank you for allowing me to participate in his care.  Please do not hesitate to call for any questions or concerns.  The duration of this appointment visit was 15 minutes of face-to-face time with the patient.  Greater than 50% of this time was spent in counseling, explanation of diagnosis,  planning of further management, and coordination of care.   Patrcia Dolly, M.D.   CC: Dr. Jonathon Jordan

## 2016-11-15 NOTE — Patient Instructions (Addendum)
Continue all your current medications. Schedule EKG with your PCP office once able. If you have another episode of passing out, call our office. Follow-up in 6 months

## 2016-12-31 DIAGNOSIS — F422 Mixed obsessional thoughts and acts: Secondary | ICD-10-CM | POA: Diagnosis not present

## 2017-01-09 ENCOUNTER — Other Ambulatory Visit: Payer: Self-pay | Admitting: Family Medicine

## 2017-01-16 ENCOUNTER — Ambulatory Visit (INDEPENDENT_AMBULATORY_CARE_PROVIDER_SITE_OTHER): Payer: Medicare Other | Admitting: Obstetrics and Gynecology

## 2017-01-16 ENCOUNTER — Encounter: Payer: Self-pay | Admitting: Obstetrics and Gynecology

## 2017-01-16 VITALS — BP 120/80 | HR 75 | Temp 98.2°F | Wt 175.0 lb

## 2017-01-16 DIAGNOSIS — L309 Dermatitis, unspecified: Secondary | ICD-10-CM

## 2017-01-16 MED ORDER — TRIAMCINOLONE ACETONIDE 0.1 % EX CREA: 1.0000 "application " | TOPICAL_CREAM | Freq: Two times a day (BID) | CUTANEOUS | 3 refills | Status: DC

## 2017-01-16 NOTE — Progress Notes (Signed)
   Subjective:   Patient ID: Bobby Liu, male    DOB: [birth year], 51 y.o.   MRN: [id number]  Patient presents for Same Day Appointment  Chief Complaint  Patient presents with  . Pruritis    hands     HPI: #RASH Patient presents with a rash on bilateral hands. States that he has had this rash on and off for the last 1-2 years. It comes and goes. Describes it as itchy. Also has a white scaly appearance. Patient has been scratching rash and has some skin breakdown. Patient states he lubricates his hands with Lubriderm lotion. Has had steroid cream in the past but has not used any recently. Rash is only located on his bilateral arms and hands. She denies any new exposures. Has not followed up with a dermatologist.  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 States that he washes his hands a lot. Wears latex gloves.  Pertinent past medical history includes autism and OCD.  Symptoms Itching: yes Pain over rash: No Feeling ill all over: No Fever: No  Review of Systems   See HPI for ROS.   History  Smoking Status  . Never Smoker  Smokeless Tobacco  . Never Used    Past medical history, surgical, family, and social history reviewed and updated in the EMR as appropriate.   Objective:  BP 120/80   Pulse 75   Temp 98.2 F (36.8 C) (Oral)   Wt 175 lb (79.4 kg)   SpO2 95%   BMI 26.61 kg/m  Vitals and nursing note reviewed  Physical Exam Gen: NAD, alert, cooperative with exam Skin: bilateral erythematous papules with varying degrees of healing along his extensor surfaces of b/l forearms extending on dorsal aspect of both hands. Associated dry scaling skin.  Some skin breakdown appreciated with exocriations. Neuro: no gross deficits.   Assessment & Plan:  1. 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 Has been occurring for the last several years. Patient uses lubricating lotion. Has used steroid cream in past with  some improvement. Rx given for refill of Kenalog to apply 2 times daily for rash. Also referral placed for dermatology. Patient may have a component of an irritant dermatitis. He works with his hands a lot. Encouraged him to lubricate hands after washing. Also to try and avoid latex products on hands. Follow-up with PCP in a week to recheck.   Diagnosis and plan along with any newly prescribed medication(s) were discussed in detail with this patient today. The patient verbalized understanding and agreed with the plan. Patient advised if symptoms worsen return to clinic.   PATIENT EDUCATION PROVIDED: See AVS   Bobby AdaJazma Norva Bowe, DO 01/16/2017, 9:14 AM PGY-3, [facility]

## 2017-01-16 NOTE — Patient Instructions (Signed)
Hand Dermatitis Hand dermatitis is a skin condition. It causes small, itchy, raised dots or fluid-filled blisters to form on the palms of the hands. This condition may also be called hand eczema. Follow these instructions at home:  Take or apply over-the-counter and prescription medicines only as told by your doctor.  If you were prescribed an antibiotic medicine, use it as told by your doctor. Do not stop using the antibiotic even if you start to feel better.  Avoid washing your hands more often than you need to.  Avoid using harsh chemicals on your hands.  Wear gloves that protect your hands when you handle products that can bother (irritate) your skin.  Keep all follow-up visits as told by your doctor. This is important. Contact a doctor if:  Your rash is not better after one week of treatment.  Your rash is red.  Your rash is tender.  Your rash has pus coming from it.  Your rash spreads. This information is not intended to replace advice given to you by your health care provider. Make sure you discuss any questions you have with your health care provider. Document Released: 01/22/2010 Document Revised: 04/04/2016 Document Reviewed: 05/12/2015 Elsevier Interactive Patient Education  2017 [employer]

## 2017-01-21 DIAGNOSIS — L235 Allergic contact dermatitis due to other chemical products: Secondary | ICD-10-CM | POA: Diagnosis not present

## 2017-01-21 DIAGNOSIS — L2089 Other atopic dermatitis: Secondary | ICD-10-CM | POA: Diagnosis not present

## 2017-01-30 DIAGNOSIS — R030 Elevated blood-pressure reading, without diagnosis of hypertension: Secondary | ICD-10-CM | POA: Diagnosis not present

## 2017-01-30 DIAGNOSIS — J3089 Other allergic rhinitis: Secondary | ICD-10-CM | POA: Diagnosis not present

## 2017-03-24 DIAGNOSIS — F840 Autistic disorder: Secondary | ICD-10-CM | POA: Diagnosis not present

## 2017-03-24 DIAGNOSIS — F422 Mixed obsessional thoughts and acts: Secondary | ICD-10-CM | POA: Diagnosis not present

## 2017-05-15 ENCOUNTER — Ambulatory Visit: Payer: Medicare Other | Admitting: Neurology

## 2017-06-30 DIAGNOSIS — F422 Mixed obsessional thoughts and acts: Secondary | ICD-10-CM | POA: Diagnosis not present

## 2017-09-16 DIAGNOSIS — F422 Mixed obsessional thoughts and acts: Secondary | ICD-10-CM | POA: Diagnosis not present

## 2017-10-06 ENCOUNTER — Other Ambulatory Visit: Payer: Self-pay | Admitting: Family Medicine

## 2017-10-23 ENCOUNTER — Encounter: Payer: Self-pay | Admitting: Internal Medicine

## 2017-10-23 ENCOUNTER — Other Ambulatory Visit: Payer: Self-pay

## 2017-10-23 ENCOUNTER — Ambulatory Visit (INDEPENDENT_AMBULATORY_CARE_PROVIDER_SITE_OTHER): Payer: Medicare Other | Admitting: Internal Medicine

## 2017-10-23 VITALS — BP 110/78 | HR 88 | Temp 98.7°F | Ht 68.0 in | Wt 178.0 lb

## 2017-10-23 DIAGNOSIS — R21 Rash and other nonspecific skin eruption: Secondary | ICD-10-CM

## 2017-10-23 DIAGNOSIS — Z23 Encounter for immunization: Secondary | ICD-10-CM

## 2017-10-23 MED ORDER — PERMETHRIN 5 % EX CREA: 1.0000 "application " | TOPICAL_CREAM | Freq: Once | CUTANEOUS | 0 refills | Status: AC

## 2017-10-23 MED ORDER — TRIAMCINOLONE ACETONIDE 0.025 % EX OINT: 1.0000 "application " | TOPICAL_OINTMENT | Freq: Two times a day (BID) | CUTANEOUS | 1 refills | Status: DC

## 2017-10-23 MED ORDER — PERMETHRIN 5 % EX CREA: 1.0000 "application " | TOPICAL_CREAM | Freq: Once | CUTANEOUS | 0 refills | Status: DC

## 2017-10-23 NOTE — Patient Instructions (Addendum)
Scabies: Permethrin Topical: Cream 5%: Thoroughly massage cream (30 g for average adult) from head to soles of feet; leave on for 8 to 14 hours before removing (shower or bath   Scabies, Adult Scabies is a skin condition that happens when very small insects get under the skin (infestation). This causes a rash and severe itchiness. Scabies can spread from person to person (is contagious). If you get scabies, it is common for others in your household to get scabies too. With proper treatment, symptoms usually go away in 2-4 weeks. Scabies usually does not cause lasting problems. What are the causes? This condition is caused by mites (Sarcoptes scabiei, or human itch mites) that can only be seen with a microscope. The mites get into the top layer of skin and lay eggs. Scabies can spread from person to person through:  Close contact with a person who has scabies.  Contact with infested items, such as towels, bedding, or clothing.  What increases the risk? This condition is more likely to develop in:  People who live in nursing homes and other extended-care facilities.  People who have sexual contact with a partner who has scabies.  Young children who attend child care facilities.  People who care for others who are at increased risk for scabies.  What are the signs or symptoms? Symptoms of this condition may include:  Severe itchiness. This is often worse at night.  A rash that includes tiny red bumps or blisters. The rash commonly occurs on the wrist, elbow, armpit, fingers, waist, groin, or buttocks. Bumps may form a line (burrow) in some areas.  Skin irritation. This can include scaly patches or sores.  How is this diagnosed? This condition is diagnosed with a physical exam. Your health care provider will look closely at your skin. In some cases, your health care provider may take a sample of your affected skin (skin scraping) and have it examined under a microscope. How is this  treated? This condition may be treated with:  Medicated cream or lotion that kills the mites. This is spread on the entire body and left on for several hours. Usually, one treatment with medicated cream or lotion is enough to kill all of the mites. In severe cases, the treatment may be repeated.  Medicated cream that relieves itching.  Medicines that help to relieve itching.  Medicines that kill the mites. This treatment is rarely used.  Follow these instructions at home:  Medicines  Take or apply over-the-counter and prescription medicines as told by your health care provider.  Apply medicated cream or lotion as told by your health care provider.  Do not wash off the medicated cream or lotion until the necessary amount of time has passed. Skin Care  Avoid scratching your affected skin.  Keep your fingernails closely trimmed to reduce injury from scratching.  Take cool baths or apply cool washcloths to help reduce itching. General instructions  Clean all items that you recently had contact with, including bedding, clothing, and furniture. Do this on the same day that your treatment starts. ? Use hot water when you wash items. ? Place unwashable items into closed, airtight plastic bags for at least 3 days. The mites cannot live for more than 3 days away from human skin. ? Vacuum furniture and mattresses that you use.  Make sure that other people who may have been infested are examined by a health care provider. These include members of your household and anyone who may have had contact  with infested items.  Keep all follow-up visits as told by your health care provider. This is important. Contact a health care provider if:  You have itching that does not go away after 4 weeks of treatment.  You continue to develop new bumps or burrows.  You have redness, swelling, or pain in your rash area after treatment.  You have fluid, blood, or pus coming from your rash. This  information is not intended to replace advice given to you by your health care provider. Make sure you discuss any questions you have with your health care provider. Document Released: 07/19/2015 Document Revised: 04/04/2016 Document Reviewed: 05/30/2015 Elsevier Interactive Patient Education  [employer]2018 Elsevier Inc.

## 2017-10-23 NOTE — Progress Notes (Signed)
   Subjective:    Bobby Liu - 51 y.o. male MRN [id number]  Date of birth: Mar 25, 1966  HPI  Bobby Liu is here for rash.  RASH  Had rash for 1-2 months. Location: dorsal surface of hands and flexural/extensor surfaces of arms bilaterally  Medications tried: Lubriderm lotion  Similar rash in past: Patient concerned may be scabies.  New medications or antibiotics: no Tick, Insect or new pet exposure: no Recent travel: no New detergent or soap: no Immunocompromised: no  Symptoms Itching: yes  Pain over rash: no Feeling ill all over: no Fever: no Mouth sores: no Face or tongue swelling: no Trouble breathing: no Joint swelling or pain: no  PMH - History of atopic dermatitis treated with Kenaolog in the past.   -  reports that  has never smoked. he has never used smokeless tobacco. - Review of Systems: Per HPI. - Past Medical History: Patient Active Problem List   Diagnosis Date Noted  . 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 Syncope 11/15/2016  . History of seizure 07/24/2016  . Overweight (BMI 25.0-29.9) 07/12/2014  . Rash and nonspecific skin eruption 11/16/2013  . AUTISM 01/16/2007  . HYPERLIPIDEMIA 01/08/2007  . DEPRESSION, MAJOR, RECURRENT 01/08/2007  . OBSESSIVE COMPUL. DISORDER 01/08/2007  . Convulsions (HCC) 01/08/2007   - Medications: reviewed and updated   Objective:   Physical Exam BP 110/78   Pulse 88   Temp 98.7 F (37.1 C) (Oral)   Ht 5\' 8"  (1.727 m)   Wt 178 lb (80.7 kg)   SpO2 98%   BMI 27.06 kg/m  Gen: NAD, alert, cooperative with exam, well-appearing Skin: bilateral erythematous patches dorsal aspect of both hands. Associated dry scaling skin. No involvement of web spaces of fingers. Extensor and flexural surfaces of arms with erythematous papules with several overlying linear excoriations and some mild skin breakdown from scratching. No drainage from lesions.  Psych: flat affect, poor eye contact       Assessment & Plan:   1. Rash and nonspecific skin eruption Given patient's concern that this appears somewhat similar to scabies in the past and extreme pruritis have elected to give Permethrin. More suspicious that this is related to atopic dermatitis as patient has experienced several exacerbations of this and has chronically dry skin. Will give Kenalog which has worked well in the past.  - permethrin (ELIMITE) 5 % cream; Apply 1 application topically once for 1 dose.  Dispense: 60 g; Refill: 0 - triamcinolone (KENALOG) 0.025 % ointment; Apply 1 application topically 2 (two) times daily.  Dispense: 30 g; Refill: 1  2. Need for immunization against influenza - Flu Vaccine QUAD 36+ mos IM  Marcy Sirenatherine Emonni Depasquale, D.O. 10/23/2017, 10:20 AM PGY-3, [facility]

## 2017-10-24 IMAGING — MR MR HEAD WO/W CM
13 of 14 series · 45 of 48 positions shown · IV contrast (multihance)
Comparison: None.

CLINICAL DATA: Epilepsy.  Syncope

EXAM:
MRI HEAD WITHOUT AND WITH CONTRAST
TECHNIQUE: Multiplanar, multiecho pulse sequences of the brain and surrounding
structures were obtained without and with intravenous contrast.
CONTRAST:  16mL MULTIHANCE GADOBENATE DIMEGLUMINE 529 MG/ML IV SOLN

[Series 3: t1_se_sag · sagittal · 5.0mm · 0.45mm/px · 1 of 21 slices shown]
[im 1/21]
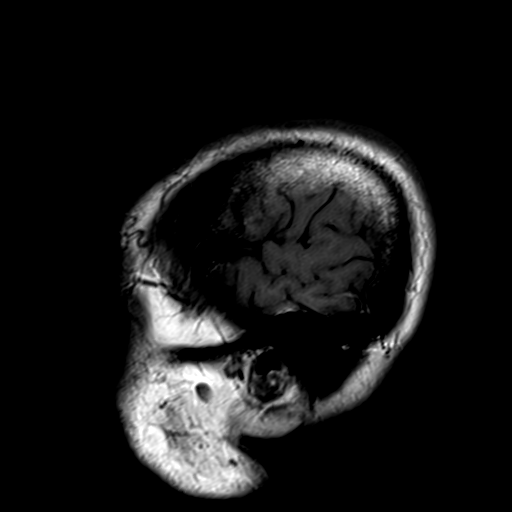

[Series 4: ep2d_diff_(id)_trace · axial · 3.0mm · 1.80mm/px · z∈[-8,+139]mm · 7 of 98 slices shown]
[im 1/98]
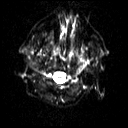
[im 17/98]
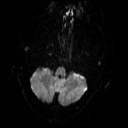
[im 33/98]
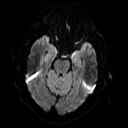
[im 49/98]
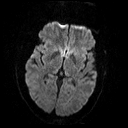
[im 65/98]
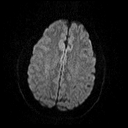
[im 81/98]
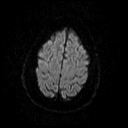
[im 98/98]
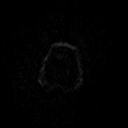

[Series 5: ep2d_diff_(id)_trace_adc · axial · 3.0mm · 1.80mm/px · z∈[-8,+139]mm · 4 of 50 slices shown]
[im 1/50]
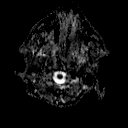
[im 17/50]
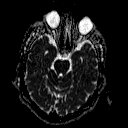
[im 33/50]
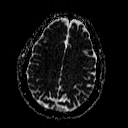
[im 50/50]
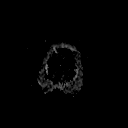

[Series 6: ep2d_diff_cor · coronal · 5.0mm · 1.77mm/px · 4 of 52 slices shown]
[im 1/52]
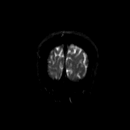
[im 18/52]
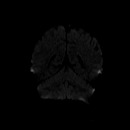
[im 35/52]
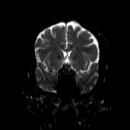
[im 52/52]
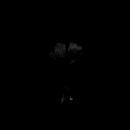

[Series 7: ep2d_diff_cor_adc · coronal · 5.0mm · 1.77mm/px · 2 of 26 slices shown]
[im 1/26]
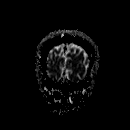
[im 26/26]
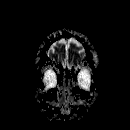

[Series 9: swi_images · axial · 2.0mm · 0.90mm/px · z∈[-13,+144]mm · 6 of 80 slices shown]
[im 1/80]
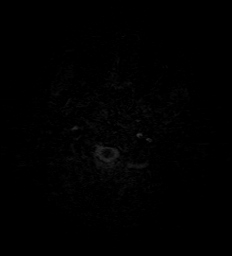
[im 16/80]
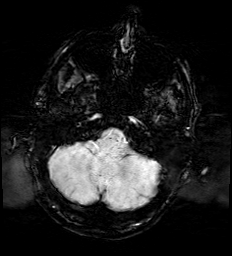
[im 32/80]
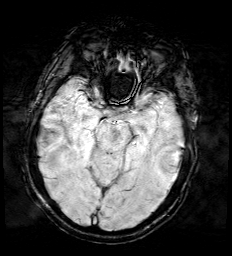
[im 48/80]
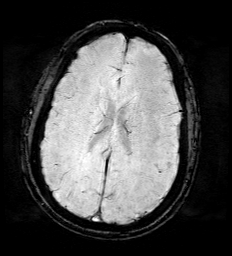
[im 64/80]
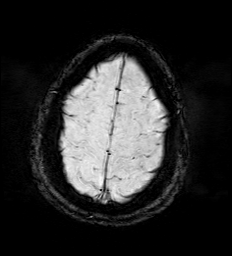
[im 80/80]
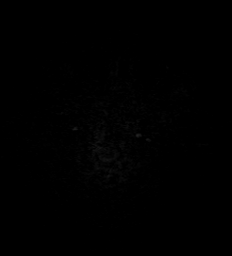

[Series 10: FLAIR · axial · 5.0mm · 0.45mm/px · z∈[-2,+135]mm · 2 of 23 slices shown]
[im 1/23]
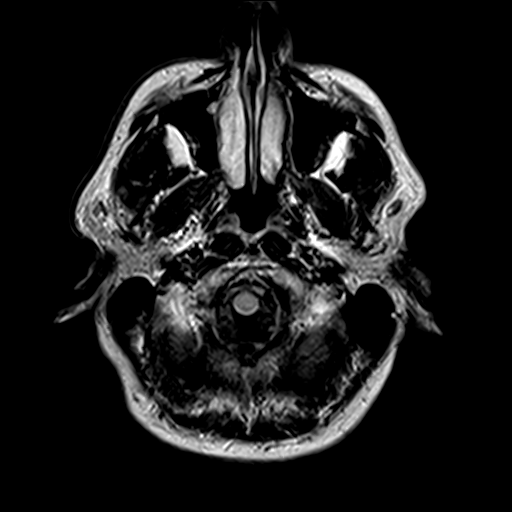
[im 23/23]
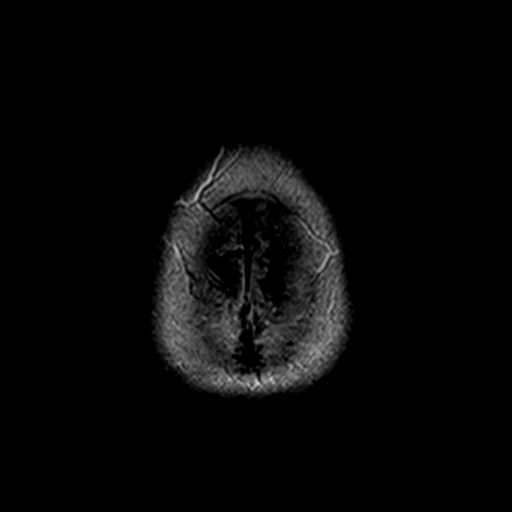

[Series 11: t2_tse_tra_512 · axial · 5.0mm · 0.60mm/px · z∈[-2,+135]mm · 2 of 23 slices shown]
[im 1/23]
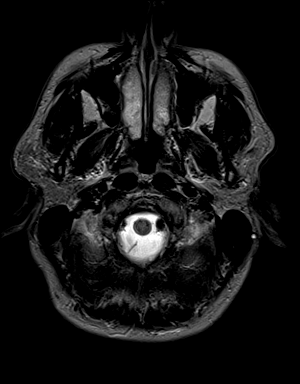
[im 23/23]
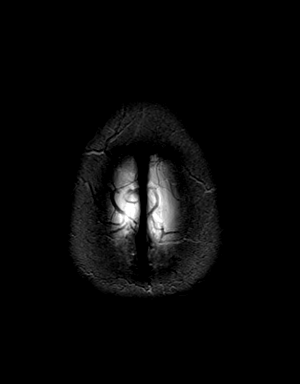

[Series 12: t1_mpr_tra · axial · 2.0mm · 0.45mm/px · z∈[-13,+145]mm · 6 of 80 slices shown]
[im 1/80]
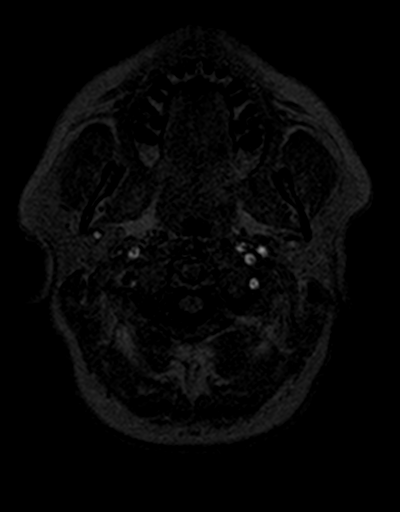
[im 16/80]
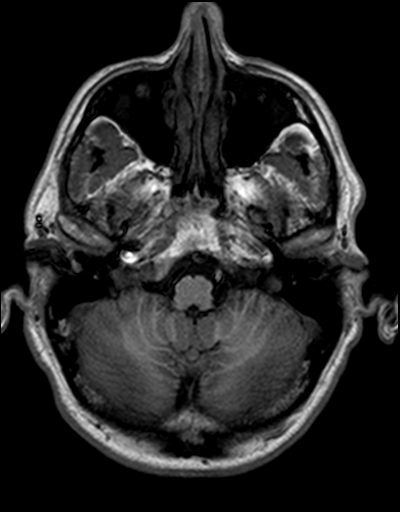
[im 32/80]
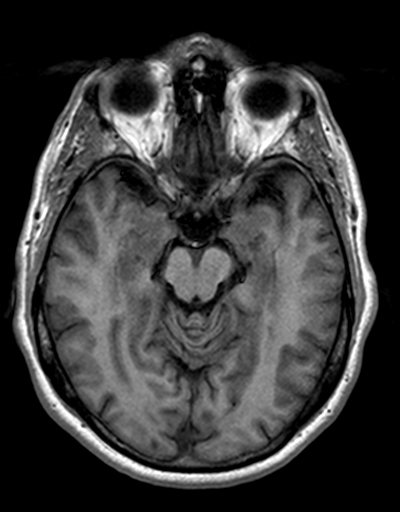
[im 48/80]
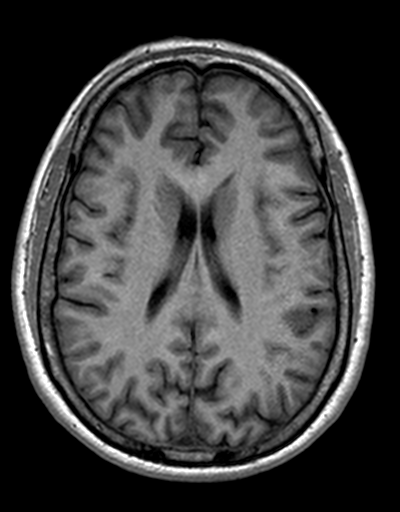
[im 64/80]
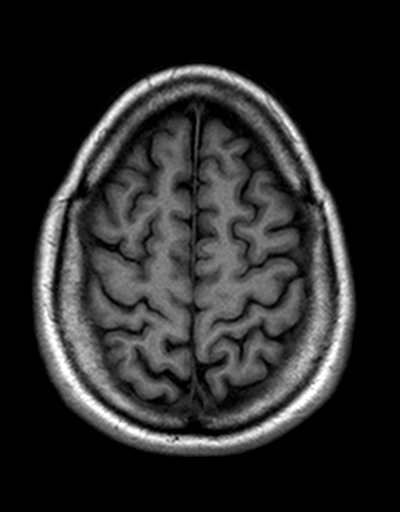
[im 80/80]
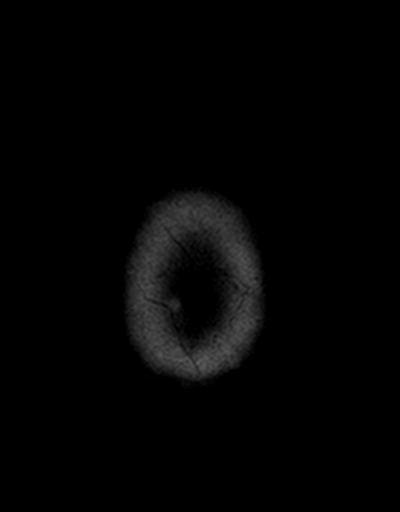

[Series 13: T2 · coronal · 3.0mm · 0.56mm/px · 2 of 27 slices shown (1 of 2)]
[im 1/27]
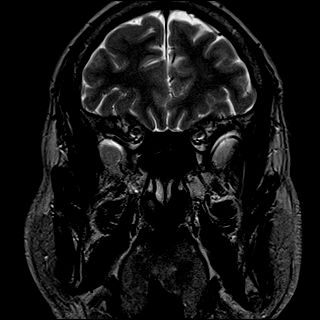
[im 27/27]
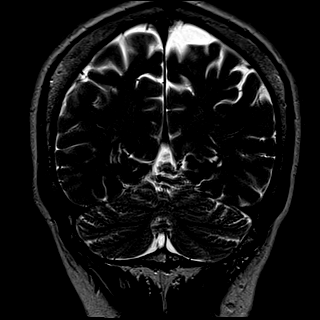

[Series 14: T2 · coronal · 5.0mm · 0.45mm/px · 2 of 26 slices shown (2 of 2)]
[im 1/26]
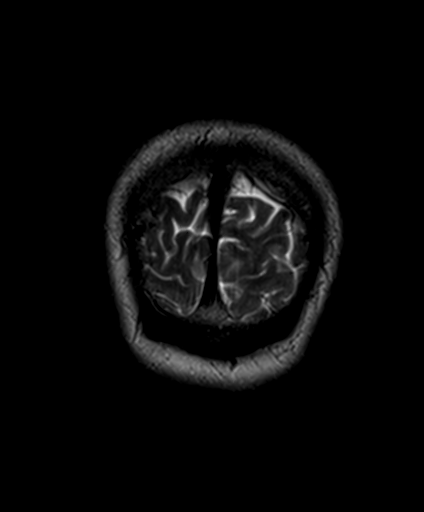
[im 26/26]
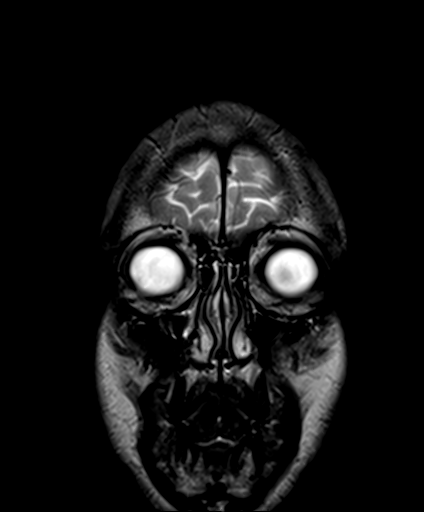

[Series 15: T1 post-contrast · coronal · 5.0mm · 0.72mm/px · 2 of 26 slices shown]
[im 1/26]
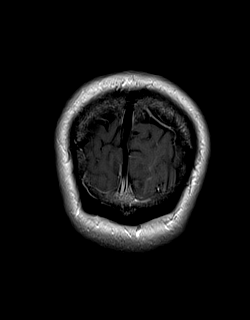
[im 26/26]
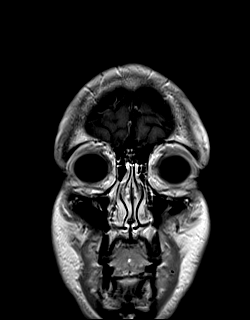

[Series 16: post t1_mpr_tra · axial · 2.0mm · 0.45mm/px · z∈[-13,+113]mm · 5 of 80 slices shown]
[im 1/80]
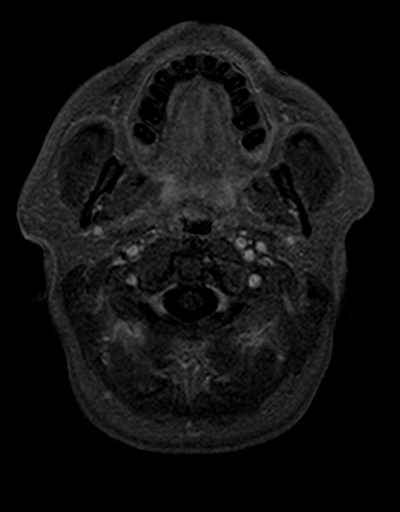
[im 16/80]
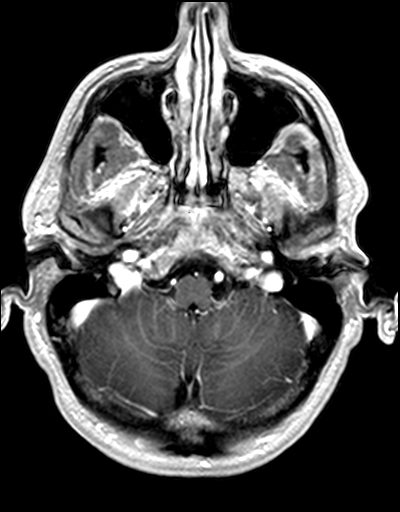
[im 32/80]
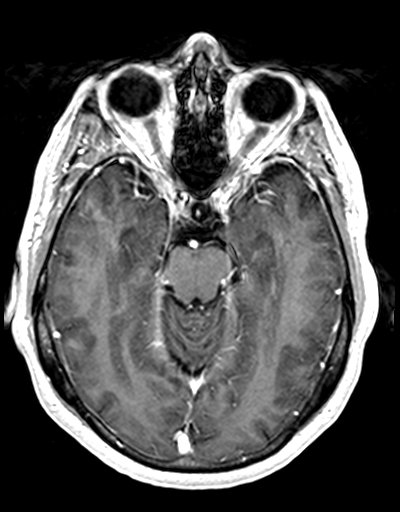
[im 48/80]
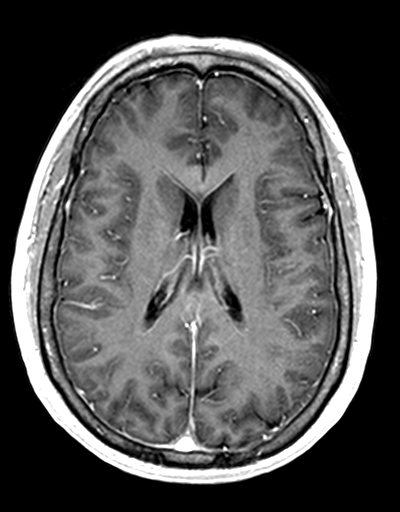
[im 64/80]
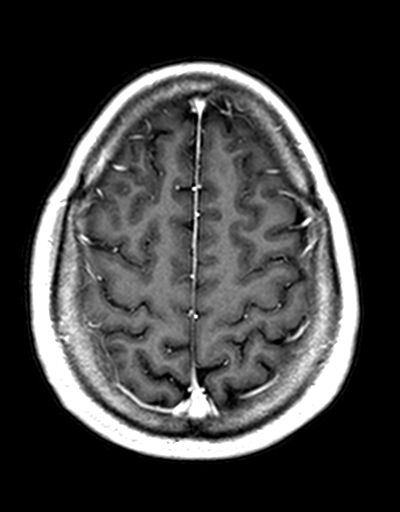

[45 of 48 positions shown; findings below may reference images not displayed]

FINDINGS: Brain: Ventricle size and cerebral volume normal. Pituitary not
enlarged. Negative for acute or chronic infarction. Normal white
matter. Negative for demyelinating disease. Brainstem and cerebellum
normal.

Negative for hemorrhage. Negative for mass or edema. Medial temporal
lobe normal in signal and volume. Negative for mesial temporal
sclerosis.

Normal enhancement following contrast infusion.

Vascular: Normal arterial flow void

Skull and upper cervical spine: Negative

Sinuses/Orbits: Negative

Other: None
IMPRESSION: Normal MRI of the brain with contrast.

## 2017-10-30 ENCOUNTER — Ambulatory Visit: Payer: Medicare Other | Admitting: Neurology

## 2017-11-14 ENCOUNTER — Other Ambulatory Visit: Payer: Self-pay | Admitting: Internal Medicine

## 2017-11-14 DIAGNOSIS — R21 Rash and other nonspecific skin eruption: Secondary | ICD-10-CM

## 2017-12-15 DIAGNOSIS — F422 Mixed obsessional thoughts and acts: Secondary | ICD-10-CM | POA: Diagnosis not present

## 2018-01-22 ENCOUNTER — Ambulatory Visit: Payer: Medicare Other | Admitting: Neurology

## 2018-02-03 ENCOUNTER — Encounter: Payer: Self-pay | Admitting: Family Medicine

## 2018-02-03 ENCOUNTER — Other Ambulatory Visit: Payer: Self-pay

## 2018-02-03 ENCOUNTER — Ambulatory Visit (INDEPENDENT_AMBULATORY_CARE_PROVIDER_SITE_OTHER): Payer: Medicare Other | Admitting: Family Medicine

## 2018-02-03 VITALS — BP 120/80 | HR 96 | Temp 97.5°F | Ht 68.0 in | Wt 170.6 lb

## 2018-02-03 DIAGNOSIS — E785 Hyperlipidemia, unspecified: Secondary | ICD-10-CM

## 2018-02-03 DIAGNOSIS — G40309 Generalized idiopathic epilepsy and epileptic syndromes, not intractable, without status epilepticus: Secondary | ICD-10-CM

## 2018-02-03 DIAGNOSIS — Z0000 Encounter for general adult medical examination without abnormal findings: Secondary | ICD-10-CM | POA: Diagnosis not present

## 2018-02-03 DIAGNOSIS — F329 Major depressive disorder, single episode, unspecified: Secondary | ICD-10-CM

## 2018-02-03 DIAGNOSIS — F32A Depression, unspecified: Secondary | ICD-10-CM

## 2018-02-03 DIAGNOSIS — F840 Autistic disorder: Secondary | ICD-10-CM

## 2018-02-03 MED ORDER — TETANUS-DIPHTH-ACELL PERTUSSIS 5-2.5-18.5 LF-MCG/0.5 IM SUSP: 0.5000 mL | Freq: Once | INTRAMUSCULAR | 0 refills | Status: AC

## 2018-02-03 NOTE — Progress Notes (Signed)
Subjective:  Bobby DarkChristopher L Liu is a 52 y.o. year old male with PMH of autism, epilepsy, hld, OCD, depression who presents to office today for an annual physical examination.  Concerns today include:  1. 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   Review of Systems  Constitutional: Negative for fever and weight loss.  HENT: Negative for ear pain, hearing loss and sinus pain.   Eyes: Negative for blurred vision.  Respiratory: Negative for cough, shortness of breath and wheezing.   Cardiovascular: Negative for chest pain and leg swelling.  Gastrointestinal: Negative for abdominal pain, blood in stool, constipation, diarrhea, heartburn, melena, nausea and vomiting.  Genitourinary: Negative for dysuria and frequency.  Musculoskeletal: Negative for back pain and joint pain.  Skin: Negative for rash.  Neurological: Negative for dizziness, tingling, focal weakness and headaches.  Psychiatric/Behavioral: Negative for depression and suicidal ideas.    General Healthcare: Medication Compliance: yes Dx Hypertension: no Dx Hyperlipidemia: yes Diabetes: No Dx Obesity: No Weight Loss: No Physical Activity: exercises while playing baseball Urinary Incontinence: No   Social:  reports that he has never smoked. He has never used smokeless tobacco. Driving: Does not drive himself Alcohol Use: No Tobacco No  Other Drugs: No  Support and Life at Home: Lives in a group home, feels very supported Advanced Directives: No Work: Works at [employer]   Health Maintenance Due  Topic Date Due  . HIV Screening  06/25/1983  . TETANUS/TDAP  10/04/2017    Past Medical History Past Medical History:  Diagnosis Date  . Autism   . Depression   . Hyperlipidemia   . OCD (obsessive compulsive disorder)   . Seizures (HCC)    Last seizure 2008. 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 [facility]   Patient Active Problem List   Diagnosis Date Noted  . 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 History of seizure 07/24/2016  . Overweight (BMI 25.0-29.9) 07/12/2014  . Autistic disorder 01/16/2007  . Hyperlipidemia 01/08/2007  . Depression 01/08/2007  . OBSESSIVE COMPUL. DISORDER 01/08/2007    Medications- reviewed and updated Current Outpatient Medications  Medication Sig Dispense Refill  . FLUoxetine (PROZAC) 20 MG capsule Take 20 mg by mouth daily. Patient reported medication. Prescribed by Psych doctor.    . lamoTRIgine (LAMICTAL) 200 MG tablet TAKE ONE TABLET EACH DAY 90 tablet 2  . triamcinolone (KENALOG) 0.025 % ointment APPLY TOPICALLY TWICE DAILY 80 g 1   No current facility-administered medications for this visit.     Objective: BP 120/80   Pulse 96   Temp (!) 97.5 F (36.4 C) (Oral)   Ht 5\' 8"  (1.727 m)   Wt 170 lb 9.6 oz (77.4 kg)   SpO2 100%   BMI 25.94 kg/m  Gen: In no acute distress, alert, cooperative with exam, well groomed HEENT: NCAT, EOMI, PERRL CV: Regular rate and rhythm, normal S1/S2, no murmur Resp: Clear to auscultation bilaterally, no wheezes, non-labored Abd: Soft, Non Tender, Non Distended, bowel sounds present, no guarding or organomegaly Ext: No edema, warm and well perfused Neuro: Alert and oriented, No gross deficits, normal gait Psych: Normal mood and affect   Assessment/Plan:  Annual physical exam Patient doing well overall.  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  Do not see any reason why he cannot participate at this time.  He is up-to-date on health maintenance except  for his Tdap vaccine.  He will need to have this done at a pharmacy as insurance will not cover at our clinic.  Prescription given for Tdap vaccine.  He will follow-up in 1 year for next annual wellness visit.  Hyperlipidemia ASCVD risk 6 % with last lipid panel. Will check again today. Discussed decreasing  fried foods  Depression History of depression and OCD.  He notes that he takes Prozac only.  His symptoms are well controlled with Prozac.  He is followed by psychiatry for this.  PHQ 9 score of 0.  Autistic disorder Patient doing well at his group home.  He is very high functioning.  He has a good support system and he also has a stable job at [employer].  Epilepsy, generalized, convulsive (HCC) History of epilepsy.  No seizures in at least 20 years.  He is following with neurology.  Continue Lamictal daily   Orders Placed This Encounter  Procedures  . CBC  . Basic Metabolic Panel  . Lipid panel    Meds ordered this encounter  Medications  . Tdap (BOOSTRIX) 5-2.5-18.5 LF-MCG/0.5 injection    Sig: Inject 0.5 mLs into the muscle once for 1 dose.    Dispense:  0.5 mL    Refill:  0     Anders Simmonds, MD [facility], PGY-3

## 2018-02-03 NOTE — Patient Instructions (Signed)
Thank you for coming in today, it was so nice to see you! Today we talked about:    You are cleared to participate in the special olympics from my standpoint. We have completed the paper work  We are checking blood work today  You need your TDAP (tetanus) vaccine. Please go to any pharmacy to have this done  Please follow up in 1 year. You can schedule this appointment at the front desk before you leave or call the clinic.  If we ordered any tests today, you will be notified via telephone of any abnormalities. If everything is normal you will get a letter in the mail.   If you have any questions or concerns, please do not hesitate to call the office at [phone]. You can also message me directly via MyChart.   Sincerely,  Anders Simmondshristina Gambino, MD

## 2018-02-04 ENCOUNTER — Telehealth: Payer: Self-pay | Admitting: Family Medicine

## 2018-02-04 LAB — LIPID PANEL
CHOL/HDL RATIO: 7.3 ratio — AB (ref 0.0–5.0)
CHOLESTEROL TOTAL: 264 mg/dL — AB (ref 100–199)
HDL: 36 mg/dL — ABNORMAL LOW (ref >39)
LDL CALC: 181 mg/dL — AB (ref 0–99)
Triglycerides: 236 mg/dL — ABNORMAL HIGH (ref 0–149)
VLDL Cholesterol Cal: 47 mg/dL — ABNORMAL HIGH (ref 5–40)

## 2018-02-04 LAB — BASIC METABOLIC PANEL
BUN/Creatinine Ratio: 12 (ref 9–20)
BUN: 15 mg/dL (ref 6–24)
CO2: 23 mmol/L (ref 20–29)
CREATININE: 1.3 mg/dL — AB (ref 0.76–1.27)
Calcium: 9.6 mg/dL (ref 8.7–10.2)
Chloride: 102 mmol/L (ref 96–106)
GFR calc Af Amer: 74 mL/min/{1.73_m2} (ref >59)
GFR calc non Af Amer: 64 mL/min/{1.73_m2} (ref >59)
GLUCOSE: 89 mg/dL (ref 65–99)
Potassium: 5 mmol/L (ref 3.5–5.2)
Sodium: 142 mmol/L (ref 134–144)

## 2018-02-04 LAB — CBC
HEMOGLOBIN: 13.5 g/dL (ref 13.0–17.7)
Hematocrit: 40.3 % (ref 37.5–51.0)
MCH: 28 pg (ref 26.6–33.0)
MCHC: 33.5 g/dL (ref 31.5–35.7)
MCV: 83 fL (ref 79–97)
Platelets: 426 10*3/uL — ABNORMAL HIGH (ref 150–379)
RBC: 4.83 x10E6/uL (ref 4.14–5.80)
RDW: 14.3 % (ref 12.3–15.4)
WBC: 7.8 10*3/uL (ref 3.4–10.8)

## 2018-02-04 NOTE — Telephone Encounter (Signed)
Call patient to discuss elevated LDL and lipid panel.  No answer and left a voicemail.    Anders Simmondshristina Siddalee Vanderheiden, MD [facility], PGY-3

## 2018-02-04 NOTE — Assessment & Plan Note (Addendum)
ASCVD risk 6 % with last lipid panel. Will check again today. Discussed decreasing fried foods

## 2018-02-04 NOTE — Assessment & Plan Note (Addendum)
History of epilepsy.  No seizures in at least 20 years.  He is following with neurology.  Continue Lamictal daily

## 2018-02-04 NOTE — Assessment & Plan Note (Signed)
History of depression and OCD.  He notes that he takes Prozac only.  His symptoms are well controlled with Prozac.  He is followed by psychiatry for this.  PHQ 9 score of 0.

## 2018-02-04 NOTE — Assessment & Plan Note (Addendum)
Patient doing well at his group home.  He is very high functioning.  He has a good support system and he also has a stable job at [employer].

## 2018-02-06 ENCOUNTER — Encounter: Payer: Self-pay | Admitting: Family Medicine

## 2018-02-06 ENCOUNTER — Telehealth: Payer: Self-pay | Admitting: Family Medicine

## 2018-02-06 NOTE — Telephone Encounter (Signed)
Called patient to discuss his blood work.   He has high cholesterol.   No answer and no option to leave voicemail. Will send a letter in the mail.  Anders Simmondshristina Vadhir Mcnay, MD [facility], PGY-3

## 2018-03-11 DIAGNOSIS — F422 Mixed obsessional thoughts and acts: Secondary | ICD-10-CM | POA: Diagnosis not present

## 2018-04-10 ENCOUNTER — Ambulatory Visit: Payer: Medicare Other | Admitting: Neurology

## 2018-04-10 ENCOUNTER — Encounter

## 2018-05-25 ENCOUNTER — Other Ambulatory Visit: Payer: Self-pay | Admitting: Family Medicine

## 2018-06-01 DIAGNOSIS — F422 Mixed obsessional thoughts and acts: Secondary | ICD-10-CM | POA: Diagnosis not present

## 2018-06-17 DIAGNOSIS — H1013 Acute atopic conjunctivitis, bilateral: Secondary | ICD-10-CM | POA: Diagnosis not present

## 2018-06-17 DIAGNOSIS — H40033 Anatomical narrow angle, bilateral: Secondary | ICD-10-CM | POA: Diagnosis not present

## 2018-09-02 DIAGNOSIS — F422 Mixed obsessional thoughts and acts: Secondary | ICD-10-CM | POA: Diagnosis not present

## 2018-10-13 ENCOUNTER — Ambulatory Visit: Payer: Medicare Other

## 2018-11-18 DIAGNOSIS — F422 Mixed obsessional thoughts and acts: Secondary | ICD-10-CM | POA: Diagnosis not present

## 2018-12-08 ENCOUNTER — Telehealth: Payer: Self-pay | Admitting: Family Medicine

## 2018-12-08 ENCOUNTER — Ambulatory Visit (INDEPENDENT_AMBULATORY_CARE_PROVIDER_SITE_OTHER): Payer: Medicare Other

## 2018-12-08 DIAGNOSIS — Z23 Encounter for immunization: Secondary | ICD-10-CM | POA: Diagnosis not present

## 2018-12-08 NOTE — Telephone Encounter (Signed)
FL2 and Physical form dropped off  at front desk for completion.  Verified that patient section of form has been completed.  Last DOS/WCC with PCP was 02/03/2018.  Placed form in team folder to be completed by clinical staff.  Herma Meringale Danielle S Magtoto

## 2018-12-09 NOTE — Telephone Encounter (Signed)
Reviewed FL2 form and completed clinical portion. Placed in PCP's box for completion.  Glennie Hawk, CMA

## 2018-12-14 NOTE — Telephone Encounter (Signed)
Filled out form and placed in nurses box  Jakera Beaupre MD PGY-2 Family Medicine Resident 

## 2018-12-15 NOTE — Telephone Encounter (Signed)
Completed forms found in RN box. Faxed to [phone] and copy mailed to patient address as requested on form. Copy made for batch scanning. Ples Specter, RN [facility] RN)

## 2018-12-28 DIAGNOSIS — F840 Autistic disorder: Secondary | ICD-10-CM | POA: Diagnosis not present

## 2019-01-05 ENCOUNTER — Telehealth: Payer: Self-pay

## 2019-01-05 ENCOUNTER — Ambulatory Visit (INDEPENDENT_AMBULATORY_CARE_PROVIDER_SITE_OTHER): Payer: Medicare Other

## 2019-01-05 VITALS — BP 120/86 | HR 76 | Temp 97.9°F | Ht 68.0 in | Wt 166.2 lb

## 2019-01-05 DIAGNOSIS — Z0000 Encounter for general adult medical examination without abnormal findings: Secondary | ICD-10-CM | POA: Diagnosis not present

## 2019-01-05 NOTE — Telephone Encounter (Signed)
Group home form dropped off for at [facility] for completion.  Verified that patient section of form has been completed.  Last DOS/WCC with PCP was 02/03/18, had AWV on 01/05/19.  Has appt with PCP on 02/16/19. Placed form in team folder to be completed by clinical staff.  Nigel Mormon, RN

## 2019-01-05 NOTE — Patient Instructions (Addendum)
Bobby Liu , Thank you for taking time to come for your Medicare Wellness Visit. I appreciate your ongoing commitment to your health goals. Please review the following plan we discussed and let me know if I can assist you in the future.   Please keep your appointment with Dr. Primitivo Gauze on 02/16/19.  These are the goals we discussed: Goals    . Patient Stated     Stay active        This is a list of the screening recommended for you and due dates:  Health Maintenance  Topic Date Due  . HIV Screening  06/25/1983  . Colon Cancer Screening  06/24/2018  . Tetanus Vaccine  01/06/2020*  . Flu Shot  Completed  *Topic was postponed. The date shown is not the original due date.     Colonoscopy, Adult A colonoscopy is an exam to look at the entire large intestine. During the exam, a lubricated, flexible tube that has a camera on the end of it is inserted into the anus and then passed into the rectum, colon, and other parts of the large intestine. You may have a colonoscopy as a part of normal colorectal screening or if you have certain symptoms, such as:  Lack of red blood cells (anemia).  Diarrhea that does not go away.  Abdominal pain.  Blood in your stool (feces). A colonoscopy can help screen for and diagnose medical problems, including:  Tumors.  Polyps.  Inflammation.  Areas of bleeding. Tell a health care provider about:  Any allergies you have.  All medicines you are taking, including vitamins, herbs, eye drops, creams, and over-the-counter medicines.  Any problems you or family members have had with anesthetic medicines.  Any blood disorders you have.  Any surgeries you have had.  Any medical conditions you have.  Any problems you have had passing stool. What are the risks? Generally, this is a safe procedure. However, problems may occur, including:  Bleeding.  A tear in the intestine.  A reaction to medicines given during the exam.  Infection  (rare). What happens before the procedure? Eating and drinking restrictions Follow instructions from your health care provider about eating and drinking, which may include:  A few days before the procedure - follow a low-fiber diet. Avoid nuts, seeds, dried fruit, raw fruits, and vegetables.  1-3 days before the procedure - follow a clear liquid diet. Drink only clear liquids, such as clear broth or bouillon, black coffee or tea, clear juice, clear soft drinks or sports drinks, gelatin dessert, and popsicles. Avoid any liquids that contain red or purple dye.  On the day of the procedure - do not eat or drink anything starting 2 hours before the procedure, or within the time period that your health care provider recommends. Up to 2 hours before the procedure, you may continue to drink clear liquids, such as water or clear fruit juice. Bowel prep If you were prescribed an oral bowel prep to clean out your colon:  Take it as told by your health care provider. Starting the day before your procedure, you will need to drink a large amount of medicated liquid. The liquid will cause you to have multiple loose stools until your stool is almost clear or light green.  If your skin or anus gets irritated from diarrhea, you may use these to relieve the irritation: ? Medicated wipes, such as adult wet wipes with aloe and vitamin E. ? A skin-soothing product like petroleum jelly.  If  you vomit while drinking the bowel prep, take a break for up to 60 minutes and then begin the bowel prep again. If vomiting continues and you cannot take the bowel prep without vomiting, call your health care provider.  To clean out your colon, you may also be given: ? Laxative medicines. ? Instructions about how to use an enema. General instructions  Ask your health care provider about: ? Changing or stopping your regular medicines or supplements. This is especially important if you are taking iron supplements, diabetes  medicines, or blood thinners. ? Taking medicines such as aspirin and ibuprofen. These medicines can thin your blood. Do not take these medicines before the procedure if your health care provider tells you not to.  Plan to have someone take you home from the hospital or clinic. What happens during the procedure?   An IV may be inserted into one of your veins.  You will be given medicine to help you relax (sedative).  To reduce your risk of infection: ? Your health care team will wash or sanitize their hands. ? Your anal area will be washed with soap.  You will be asked to lie on your side with your knees bent.  Your health care provider will lubricate a long, thin, flexible tube. The tube will have a camera and a light on the end.  The tube will be inserted into your anus.  The tube will be gently eased through your rectum and colon.  Air will be delivered into your colon to keep it open. You may feel some pressure or cramping.  The camera will be used to take images during the procedure.  A small tissue sample may be removed to be examined under a microscope (biopsy).  If small polyps are found, your health care provider may remove them and have them checked for cancer cells.  When the exam is done, the tube will be removed. The procedure may vary among health care providers and hospitals. What happens after the procedure?  Your blood pressure, heart rate, breathing rate, and blood oxygen level will be monitored until the medicines you were given have worn off.  Do not drive for 24 hours after the exam.  You may have a small amount of blood in your stool.  You may pass gas and have mild abdominal cramping or bloating due to the air that was used to inflate your colon during the exam.  It is up to you to get the results of your procedure. Ask your health care provider, or the department performing the procedure, when your results will be ready. Summary  A colonoscopy is  an exam to look at the entire large intestine.  During a colonoscopy, a lubricated, flexible tube with a camera on the end of it is inserted into the anus and then passed into the colon and other parts of the large intestine.  Follow instructions from your health care provider about eating and drinking before the procedure.  If you were prescribed an oral bowel prep to clean out your colon, take it as told by your health care provider.  After your procedure, your blood pressure, heart rate, breathing rate, and blood oxygen level will be monitored until the medicines you were given have worn off. This information is not intended to replace advice given to you by your health care provider. Make sure you discuss any questions you have with your health care provider. Document Released: 10/25/2000 Document Revised: 08/20/2017 Document Reviewed: 01/09/2016 Elsevier  Interactive Patient Education  [employer].

## 2019-01-05 NOTE — Telephone Encounter (Signed)
Reviewed Group Home form and placed in PCP's box for completion.  Glennie Hawk, CMA

## 2019-01-05 NOTE — Progress Notes (Signed)
I agree with this documentation  Myrene Buddy MD PGY-2 Family Medicine Resident

## 2019-01-05 NOTE — Progress Notes (Signed)
Subjective:   Bobby Liu is a 53 y.o. male who presents for an Initial Medicare Annual Wellness Visit.  Review of Systems  Physical assessment deferred to PCP.  Cardiac Risk Factors include: male gender    Objective:    Today's Vitals   01/05/19 0921  BP: 120/86  Pulse: 76  Temp: 97.9 F (36.6 C)  TempSrc: Oral  SpO2: 96%  Weight: 166 lb 3.2 oz (75.4 kg)  Height: 5\' 8"  (1.727 m)  PainSc: 0-No pain   Body mass index is 25.27 kg/m.  Advanced Directives 01/05/2019 10/23/2017 01/16/2017 11/23/2015 07/31/2015  Does Patient Have a Medical Advance Directive? No No No No No  Would patient like information on creating a medical advance directive? No - Patient declined No - Patient declined No - Patient declined No - patient declined information No - patient declined information    Current Medications (verified) Outpatient Encounter Medications as of 01/05/2019  Medication Sig  . FLUoxetine (PROZAC) 20 MG capsule Take 20 mg by mouth daily. Patient reported medication. Prescribed by Psych doctor.  . lamoTRIgine (LAMICTAL) 200 MG tablet TAKE ONE TABLET DAILY  . triamcinolone (KENALOG) 0.025 % ointment APPLY TOPICALLY TWICE DAILY   No facility-administered encounter medications on file as of 01/05/2019.     Allergies (verified) Phenobarbital   History: Past Medical History:  Diagnosis Date  . Autism   . Depression   . Hyperlipidemia   . OCD (obsessive compulsive disorder)   . Seizures (HCC)    Last seizure 2008. 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 Sees Dr Alvester Morin at [facility]).   Past Surgical History:  Procedure Laterality Date  . TOOTH EXTRACTION     History reviewed. No pertinent family history. Social History   Socioeconomic History  . Marital status: Single    Spouse name: Not on file  . Number of children: Not on file  . Years of education: 66  . Highest education level: 12th grade  Occupational History    Employer: [employer]    Comment: dishwasher and drink server  Social Needs  . Financial resource strain: Not hard at all  . Food insecurity:    Worry: Never true    Inability: Never true  . Transportation needs:    Medical: No    Non-medical: No  Tobacco Use  . Smoking status: Never Smoker  . Smokeless tobacco: Never Used  Substance and Sexual Activity  . Alcohol use: No  . Drug use: No  . Sexual activity: Never  Lifestyle  . Physical activity:    Days per week: 0 days    Minutes per session: 0 min  . Stress: Not at all  Relationships  . Social connections:    Talks on phone: Never    Gets together: Once a week    Attends religious service: 1 to 4 times per year    Active member of club or organization: No    Attends meetings of clubs or organizations: Never    Relationship status: Never married  Other Topics Concern  . Not on file  Social History Narrative   Works as Public affairs consultant and drink server at [employer] Parents divorced.  Sees dad every 3-4 months.  No relationship with mom.  Raised by father/maternal GM.  Grandma passed away 06-01-09.  One brother in good health.  Sister in good health.      Athlete in [employer] team: plays soccer,  basketball).      [facility]ives in [facility] with Leander Rams, wife and family, 2 other clients. No pets. Smoke alarms present. No tripping hazards. Grab bars in restroom.      Reports no seizures since 1999. Stable on Lamictal. Very high functioning autistic patient with OCD.   Tobacco Counseling Counseling given: No   Clinical Intake:  Pre-visit preparation completed: Yes  Pain : No/denies pain Pain Score: 0-No pain     Nutritional Status: BMI of 19-24  Normal Diabetes: No  How often do you need to have someone help you when you read instructions, pamphlets, or other written materials from your doctor or pharmacy?: 1 - Never What is the last grade level you completed in school?: High  school graduate  Interpreter Needed?: No     Activities of Daily Living In your present state of health, do you have any difficulty performing the following activities: 01/05/2019  Hearing? N  Vision? N  Comment last eye exm 2019  Difficulty concentrating or making decisions? N  Walking or climbing stairs? N  Dressing or bathing? N  Doing errands, shopping? N  Preparing Food and eating ? Y  Comment Bobby Liu does the cooking.  Using the Toilet? N  In the past six months, have you accidently leaked urine? N  Do you have problems with loss of bowel control? N  Managing your Medications? N  Managing your Finances? Y  Housekeeping or managing your Housekeeping? Y  Some recent data might be hidden     Immunizations and Health Maintenance Immunization History  Administered Date(s) Administered  . Influenza Whole 08/21/2009  . Influenza,inj,Quad PF,6+ Mos 07/19/2013, 10/23/2017, 12/08/2018  . Td 10/05/2007   Health Maintenance Due  Topic Date Due  . HIV Screening  06/25/1983  . COLONOSCOPY  06/24/2018    Patient Care Team: Myrene Buddy, MD as PCP - General (Family Medicine) Van Clines, MD as Consulting Physician (Neurology) Vesta Mixer [facility]) [facility] (Dentistry) [facility] (Optometry)  Indicate any recent Medical Services you may have received from other than [facility] providers in the past year (date may be approximate).    Assessment:   This is a routine wellness examination for Bobby Liu.  Hearing/Vision screen  Hearing Screening   Method: Audiometry             Right ear:   Pass Pass Pass  Pass    Left ear:   Pass Pass Pass  Pass      Visual Acuity Screening   Right eye Left eye Both eyes  Without correction:     With correction:    Dietary issues and exercise activities discussed: Current Exercise Habits: The patient does not participate in regular exercise  at present  Goals    . Patient Stated     Stay active       Depression Screen PHQ 2/9 Scores 01/05/2019 02/03/2018 02/03/2018 10/23/2017  PHQ - 2 Score - 0 0 0  Exception Documentation Medical reason - - -    Fall Risk Fall Risk  01/05/2019 02/03/2018 11/15/2016 08/01/2016 07/24/2016  Falls in the past year? 0 No No No Yes  Injury with Fall? - - - - No  Risk for fall due to : Mental status change - - - -  Follow up Falls prevention discussed - - - -    Is the patient's home free of loose throw rugs in walkways, pet beds, electrical cords, etc?  yes      Grab bars in the bathroom? yes      Handrails on the stairs?   yes      Adequate lighting?   yes   Cognitive Function: MMSE - Mini Mental State Exam 01/05/2019  Orientation to time 5  Orientation to Place 5  Registration 3  Attention/ Calculation 5  Recall 3  Language- name 2 objects 2  Language- repeat 1  Language- follow 3 step command 3  Language- read & follow direction 1  Write a sentence 1  Copy design 1  Total score 30     6CIT Screen 01/05/2019  What Year? 0 points  What month? 0 points  What time? 0 points  Count back from 20 0 points  Months in reverse 0 points    Screening Tests Health Maintenance  Topic Date Due  . HIV Screening  06/25/1983  . COLONOSCOPY  06/24/2018  . TETANUS/TDAP  01/06/2020 (Originally 10/04/2017)  . INFLUENZA VACCINE  Completed    Cancer Screenings: Lung: Low Dose CT Chest recommended if Age 35-80 years, 30 pack-year currently smoking OR have quit w/in 15years. Patient does not qualify. Colorectal: will discuss with PCP in April  Additional Screenings:  Needs HIV screening      Plan:  Patient declined Tdap and wants to discuss HIV screening and colonoscopy with PCP when caregiver present. Appointment made.  I have personally reviewed and noted the following in the patient's chart:   . Medical and social history . Use of alcohol, tobacco or illicit drugs  . Current  medications and supplements . Functional ability and status . Nutritional status . Physical activity . Advanced directives . List of other physicians . Hospitalizations, surgeries, and ER visits in previous 12 months . Vitals . Screenings to include cognitive, depression, and falls . Referrals and appointments  In addition, I have reviewed and discussed with patient certain preventive protocols, quality metrics, and best practice recommendations. A written personalized care plan for preventive services as well as general preventive health recommendations were provided to patient.     Nigel Mormon, RN   01/05/2019

## 2019-01-06 NOTE — Telephone Encounter (Signed)
Copy placed in batch scanning.  Original mailed to [id number] as requested. Ronte Parker, Maryjo Rochester, CMA

## 2019-01-06 NOTE — Telephone Encounter (Signed)
Form placed in nurse's box  Myrene Buddy MD PGY-2 Family Medicine Resident

## 2019-02-12 ENCOUNTER — Other Ambulatory Visit: Payer: Self-pay | Admitting: Family Medicine

## 2019-02-16 ENCOUNTER — Ambulatory Visit: Payer: Medicare Other | Admitting: Family Medicine

## 2019-04-06 DIAGNOSIS — F422 Mixed obsessional thoughts and acts: Secondary | ICD-10-CM | POA: Diagnosis not present

## 2019-04-06 DIAGNOSIS — F840 Autistic disorder: Secondary | ICD-10-CM | POA: Diagnosis not present

## 2019-04-14 ENCOUNTER — Other Ambulatory Visit: Payer: Self-pay

## 2019-04-14 ENCOUNTER — Encounter: Payer: Self-pay | Admitting: Family Medicine

## 2019-04-14 ENCOUNTER — Ambulatory Visit (INDEPENDENT_AMBULATORY_CARE_PROVIDER_SITE_OTHER): Payer: Medicare Other | Admitting: Family Medicine

## 2019-04-14 VITALS — BP 110/68 | HR 80

## 2019-04-14 DIAGNOSIS — H6123 Impacted cerumen, bilateral: Secondary | ICD-10-CM | POA: Diagnosis not present

## 2019-04-14 NOTE — Assessment & Plan Note (Signed)
Cerumen removal performed in usual fashion.  Patient's hearing greatly improved after removal.  Gave patient handout for over-the-counter medication, Debrox.  Says it will hopefully prevent reaccumulation, especially this time of the year when patient is to have worse allergies.

## 2019-04-14 NOTE — Progress Notes (Signed)
   HPI 53 year old male who presents for trouble hearing.  He states that his hearing has progressively gotten worse over the course of the last week or 2.  Thinks it is likely due to too much earwax in his ear.  States that this has happened before, around the time of the year by secondary to allergens.  Bilateral cerumen impaction cleared in usual fashion.  Patient states that his hearing has greatly improved after cerumen removal.  CC: Trouble hearing   ROS:   Review of Systems See HPI for ROS.   CC, SH/smoking status, and VS noted  Objective: BP 110/68   Pulse 80   SpO2 64%  Gen: 53 year old Caucasian male, resting comfortably, no acute distress HEENT: Bilateral cerumen impaction present.  Post removal ear canals clean and dry, no effusion present in either tympanic membrane. CV: RRR, no murmur Resp: CTAB, no wheezes, non-labored Abd: SNTND, BS present, no guarding or organomegaly Neuro: Alert and oriented, Speech clear, No gross deficits   Assessment and plan:  Bilateral impacted cerumen Cerumen removal performed in usual fashion.  Patient's hearing greatly improved after removal.  Gave patient handout for over-the-counter medication, Debrox.  Says it will hopefully prevent reaccumulation, especially this time of the year when patient is to have worse allergies.   No orders of the defined types were placed in this encounter.   No orders of the defined types were placed in this encounter.    Myrene Buddy MD PGY-2 Family Medicine Resident  04/14/2019 4:24 PM

## 2019-04-14 NOTE — Patient Instructions (Signed)
It was great seeing you today!  Sorry because her hearing was impaired due to earwax in both ears.  This was cleaned out and your hearing returned to normal.  This is likely due to increased allergens in the air which caused your ear to create more earwax.  You can use Debrox drops to soften the wax your ear makes, which will prevent it from accumulating and becoming solid.  Please come back and see me if you have any problems.

## 2019-08-04 DIAGNOSIS — Z23 Encounter for immunization: Secondary | ICD-10-CM | POA: Diagnosis not present

## 2019-08-18 ENCOUNTER — Other Ambulatory Visit: Payer: Self-pay | Admitting: Family Medicine

## 2019-08-18 NOTE — Telephone Encounter (Signed)
Pt came in office to request refill of: Lamictal  Name of Medication(s):  Lamictac Last date of OV:  04/14/2019 [facility]acy:  [facility]  Will route refill request to Clinic RN.  Discussed with patient policy to call pharmacy for future refills.  Also, discussed refills may take up to 48 hours to approve or deny.  Bobby Liu

## 2019-08-19 MED ORDER — LAMOTRIGINE 200 MG PO TABS: 200.0000 mg | ORAL_TABLET | Freq: Every day | ORAL | 2 refills | Status: DC

## 2019-08-20 ENCOUNTER — Ambulatory Visit: Payer: Medicare Other | Admitting: Family Medicine

## 2019-09-14 DIAGNOSIS — F422 Mixed obsessional thoughts and acts: Secondary | ICD-10-CM | POA: Diagnosis not present

## 2019-09-14 DIAGNOSIS — F840 Autistic disorder: Secondary | ICD-10-CM | POA: Diagnosis not present

## 2019-09-28 ENCOUNTER — Other Ambulatory Visit: Payer: Self-pay

## 2019-09-28 ENCOUNTER — Ambulatory Visit (INDEPENDENT_AMBULATORY_CARE_PROVIDER_SITE_OTHER): Payer: Medicare Other | Admitting: Family Medicine

## 2019-09-28 ENCOUNTER — Encounter: Payer: Self-pay | Admitting: Family Medicine

## 2019-09-28 VITALS — BP 98/62 | HR 89 | Wt 164.6 lb

## 2019-09-28 DIAGNOSIS — Z114 Encounter for screening for human immunodeficiency virus [HIV]: Secondary | ICD-10-CM

## 2019-09-28 DIAGNOSIS — H6123 Impacted cerumen, bilateral: Secondary | ICD-10-CM | POA: Diagnosis not present

## 2019-09-28 DIAGNOSIS — Z0000 Encounter for general adult medical examination without abnormal findings: Secondary | ICD-10-CM | POA: Diagnosis not present

## 2019-09-28 DIAGNOSIS — F329 Major depressive disorder, single episode, unspecified: Secondary | ICD-10-CM | POA: Diagnosis not present

## 2019-09-28 DIAGNOSIS — G40309 Generalized idiopathic epilepsy and epileptic syndromes, not intractable, without status epilepticus: Secondary | ICD-10-CM

## 2019-09-28 DIAGNOSIS — N189 Chronic kidney disease, unspecified: Secondary | ICD-10-CM | POA: Diagnosis not present

## 2019-09-28 DIAGNOSIS — Z1211 Encounter for screening for malignant neoplasm of colon: Secondary | ICD-10-CM | POA: Diagnosis not present

## 2019-09-28 DIAGNOSIS — E785 Hyperlipidemia, unspecified: Secondary | ICD-10-CM

## 2019-09-28 DIAGNOSIS — F32A Depression, unspecified: Secondary | ICD-10-CM

## 2019-09-28 NOTE — Patient Instructions (Signed)
It was great seeing you again today!  We are going to get a a lipid panel, kidney function test, and an HIV screening test.  I also made a referral to a gastroenterologist for a colonoscopy.  They will call you to schedule that appointment.

## 2019-09-29 LAB — BASIC METABOLIC PANEL
BUN/Creatinine Ratio: 11 (ref 9–20)
BUN: 13 mg/dL (ref 6–24)
CO2: 25 mmol/L (ref 20–29)
Calcium: 9.7 mg/dL (ref 8.7–10.2)
Chloride: 101 mmol/L (ref 96–106)
Creatinine, Ser: 1.22 mg/dL (ref 0.76–1.27)
GFR calc Af Amer: 79 mL/min/{1.73_m2} (ref >59)
GFR calc non Af Amer: 68 mL/min/{1.73_m2} (ref >59)
Glucose: 86 mg/dL (ref 65–99)
Potassium: 4 mmol/L (ref 3.5–5.2)
Sodium: 142 mmol/L (ref 134–144)

## 2019-09-29 LAB — HIV ANTIBODY (ROUTINE TESTING W REFLEX): HIV Screen 4th Generation wRfx: NONREACTIVE

## 2019-09-29 LAB — LIPID PANEL
Chol/HDL Ratio: 8 ratio — ABNORMAL HIGH (ref 0.0–5.0)
Cholesterol, Total: 264 mg/dL — ABNORMAL HIGH (ref 100–199)
HDL: 33 mg/dL — ABNORMAL LOW
LDL Chol Calc (NIH): 184 mg/dL — ABNORMAL HIGH (ref 0–99)
Triglycerides: 243 mg/dL — ABNORMAL HIGH (ref 0–149)
VLDL Cholesterol Cal: 47 mg/dL — ABNORMAL HIGH (ref 5–40)

## 2019-10-02 NOTE — Progress Notes (Signed)
   HPI 53 year old male who presents for annual physical.  He is having no issues at this time.  He presents from his group home and needs a form filled out stating he was seen in clinic.  He is a diagnosis of autistic disorder, obsessive-compulsive disorder, and depression in his chart.  This is managed by psychiatry.  He also has a history of epilepsy, which is treated by Lamictal 200 mg.  He is doing very well on Prozac 20 mg and Lamictal 200 mg.  He has been doing very well since having a cerumen removed on 04/14/2019.  States his hearing is back to normal.  He has not been using the Debrox drops.  Upon chart review he is overdue for a colonoscopy and HIV screening test.  Lipid panel from over a year ago found to be quite high.  Creatinine 1.3 at last check, GFR 64.  Will recheck BMP as well to better evaluate.  CC: Annual physical  ROS:   Review of Systems See HPI for ROS.   CC, SH/smoking status, and VS noted  Objective: BP 98/62   Pulse 89   Wt 164 lb 9.6 oz (74.7 kg)   SpO2 98%   BMI 25.03 kg/m  Gen: 53 year old Caucasian male, no acute distress HEENT: Severe bilateral cerumen impaction, EOMI, PERRLA, no lymphadenopathy in cervical chain appreciated CV: Regular rate rhythm, no M/R/G Resp: Lungs clear to auscultation bilaterally, no accessory muscle use Abd: Soft, nontender, nondistended Neuro: CN II to XII intact, no focal neurologic deficit  Assessment and plan:  Epilepsy, generalized, convulsive ([place]) Continues to follow with neurology, no seizures for 20 years.  Continue Lamictal 200 mg daily.  Bilateral impacted cerumen Reimpacted.  Not affecting his hearing at this time.  Recommended starting Debrox drops, can follow-up for removal if needed.  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  Takes Prozac 20 mg daily, depression and obsessive-compulsive disorder well managed with this medicine.  Continue to follow psychiatry.  Healthcare maintenance Overdue for  colonoscopy and HIV.  Referral to GI for colonoscopy placed.  HIV negative.  Hyperlipidemia Patient with quite high cholesterol at last check 1 year ago.  Recheck lipid panel with results of: Cholesterol 264, HDL 33, LDL 184.  ASCVD risk 5.3%.  Plan to start statin.  We will start off on medium intensity atorvastatin 20 mg.  Recheck lipid panel in a year.   Orders Placed This Encounter  Procedures  . Basic Metabolic Panel    Order Specific Question:   Has the patient fasted?    Answer:   No  . HIV antibody (with reflex)  . Lipid panel    Order Specific Question:   Has the patient fasted?    Answer:   No  . Ambulatory referral to Gastroenterology    Referral Priority:   Routine    Referral Type:   Consultation    Referral Reason:   Specialty Services Required    Number of Visits Requested:   1    No orders of the defined types were placed in this encounter.    Guadalupe Dawn MD PGY-3 Family Medicine Resident  10/04/2019 12:06 PM

## 2019-10-04 ENCOUNTER — Encounter: Payer: Self-pay | Admitting: Family Medicine

## 2019-10-04 DIAGNOSIS — Z0000 Encounter for general adult medical examination without abnormal findings: Secondary | ICD-10-CM | POA: Insufficient documentation

## 2019-10-04 NOTE — Assessment & Plan Note (Signed)
Overdue for colonoscopy and HIV.  Referral to GI for colonoscopy placed.  HIV negative.

## 2019-10-04 NOTE — Assessment & Plan Note (Signed)
Reimpacted.  Not affecting his hearing at this time.  Recommended starting Debrox drops, can follow-up for removal if needed.

## 2019-10-04 NOTE — Assessment & Plan Note (Signed)
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  Takes Prozac 20 mg daily, depression and obsessive-compulsive disorder well managed with this medicine.  Continue to follow psychiatry.

## 2019-10-04 NOTE — Assessment & Plan Note (Signed)
Patient with quite high cholesterol at last check 1 year ago.  Recheck lipid panel with results of: Cholesterol 264, HDL 33, LDL 184.  ASCVD risk 5.3%.  Plan to start statin.  We will start off on medium intensity atorvastatin 20 mg.  Recheck lipid panel in a year.

## 2019-10-04 NOTE — Assessment & Plan Note (Signed)
Continues to follow with neurology, no seizures for 20 years.  Continue Lamictal 200 mg daily.

## 2019-12-01 DIAGNOSIS — F3132 Bipolar disorder, current episode depressed, moderate: Secondary | ICD-10-CM | POA: Diagnosis not present

## 2019-12-01 DIAGNOSIS — F200 Paranoid schizophrenia: Secondary | ICD-10-CM | POA: Diagnosis not present

## 2019-12-01 DIAGNOSIS — F203 Undifferentiated schizophrenia: Secondary | ICD-10-CM | POA: Diagnosis not present

## 2019-12-01 DIAGNOSIS — F422 Mixed obsessional thoughts and acts: Secondary | ICD-10-CM | POA: Diagnosis not present

## 2019-12-01 DIAGNOSIS — F209 Schizophrenia, unspecified: Secondary | ICD-10-CM | POA: Diagnosis not present

## 2019-12-01 DIAGNOSIS — F439 Reaction to severe stress, unspecified: Secondary | ICD-10-CM | POA: Diagnosis not present

## 2019-12-01 DIAGNOSIS — F411 Generalized anxiety disorder: Secondary | ICD-10-CM | POA: Diagnosis not present

## 2019-12-01 DIAGNOSIS — F3341 Major depressive disorder, recurrent, in partial remission: Secondary | ICD-10-CM | POA: Diagnosis not present

## 2019-12-01 DIAGNOSIS — F250 Schizoaffective disorder, bipolar type: Secondary | ICD-10-CM | POA: Diagnosis not present

## 2020-01-09 ENCOUNTER — Ambulatory Visit: Payer: Medicare Other | Attending: Internal Medicine

## 2020-01-09 DIAGNOSIS — Z23 Encounter for immunization: Secondary | ICD-10-CM | POA: Insufficient documentation

## 2020-01-09 NOTE — Progress Notes (Signed)
   Covid-19 Vaccination Clinic  Name:  Bobby Liu    MRN: [id number] DOB: 01/21/1966  01/09/2020  Mr. Bobby Liu was observed post Covid-19 immunization for 15 minutes without incidence. He was provided with Vaccine Information Sheet and instruction to access the V-Safe system.   Mr. Bobby Liu was instructed to call 911 with any severe reactions post vaccine: Marland Kitchen Difficulty breathing  . Swelling of your face and throat  . A fast heartbeat  . A bad rash all over your body  . Dizziness and weakness    Immunizations Administered    Name Date Dose VIS Date Route   Moderna COVID-19 Vaccine 01/09/2020 11:14 AM 0.5 mL 10/12/2019 Intramuscular   Manufacturer: Moderna   Lot: [id number]   NDC: 79536-922-30

## 2020-02-12 ENCOUNTER — Ambulatory Visit: Payer: Medicare Other | Attending: Internal Medicine

## 2020-02-12 DIAGNOSIS — Z23 Encounter for immunization: Secondary | ICD-10-CM

## 2020-02-12 NOTE — Progress Notes (Signed)
   Covid-19 Vaccination Clinic  Name:  CHURCHILL GRIMSLEY    MRN: [id number] DOB: May 30, 1966  02/12/2020  Mr. Fout was observed post Covid-19 immunization for 15 minutes without incident. He was provided with Vaccine Information Sheet and instruction to access the V-Safe system.   Mr. Mcglinchey was instructed to call 911 with any severe reactions post vaccine: Marland Kitchen Difficulty breathing  . Swelling of face and throat  . A fast heartbeat  . A bad rash all over body  . Dizziness and weakness   Immunizations Administered    Name Date Dose VIS Date Route   Moderna COVID-19 Vaccine 02/12/2020 10:55 AM 0.5 mL 10/12/2019 Intramuscular   Manufacturer: Moderna   Lot: [id number]   NDC: 91694-503-88

## 2020-02-23 DIAGNOSIS — F840 Autistic disorder: Secondary | ICD-10-CM | POA: Diagnosis not present

## 2020-02-23 DIAGNOSIS — F422 Mixed obsessional thoughts and acts: Secondary | ICD-10-CM | POA: Diagnosis not present

## 2020-08-17 DIAGNOSIS — F422 Mixed obsessional thoughts and acts: Secondary | ICD-10-CM | POA: Diagnosis not present

## 2020-08-17 DIAGNOSIS — F429 Obsessive-compulsive disorder, unspecified: Secondary | ICD-10-CM | POA: Diagnosis not present

## 2020-08-17 DIAGNOSIS — F840 Autistic disorder: Secondary | ICD-10-CM | POA: Diagnosis not present

## 2020-09-27 ENCOUNTER — Encounter: Payer: Self-pay | Admitting: Family Medicine

## 2020-09-27 ENCOUNTER — Ambulatory Visit (INDEPENDENT_AMBULATORY_CARE_PROVIDER_SITE_OTHER): Payer: Medicare Other | Admitting: Family Medicine

## 2020-09-27 ENCOUNTER — Other Ambulatory Visit: Payer: Self-pay

## 2020-09-27 VITALS — BP 118/72 | HR 92 | Wt 171.4 lb

## 2020-09-27 DIAGNOSIS — H6123 Impacted cerumen, bilateral: Secondary | ICD-10-CM

## 2020-09-27 NOTE — Progress Notes (Signed)
   Subjective:   Patient ID: ESHAWN COOR    DOB: Dec 18, 1965, 54 y.o. male   MRN: [id number]  Bobby Liu is a 54 y.o. male  here for ear cleaning.  Ear Cleaning:  Patient presents for right ear cleaning.  He notes that last night he noticed decreased hearing from his right ear.  Denies any pain or ringing in his ears.  He would like the ears to be clean.  Review of Systems:  Per HPI.   Objective:   BP 118/72   Pulse 92   Wt 171 lb 6.4 oz (77.7 kg)   SpO2 97%   BMI 26.06 kg/m  Vitals and nursing note reviewed.  General: pleasant older gentleman, sitting comfortably in exam chair, well nourished, well developed, in no acute distress with non-toxic appearance HEENT: significant cerumen bilaterally successfully cleaned with solution and curette  Resp: breathing comfortably on room air, speaking in full sentences Skin: warm, dry MSK: gait normal Neuro: Alert and oriented, speech normal  Assessment & Plan:   Bilateral impacted cerumen Chronic and recurrent. Successful debridement with ear solution and manual removal with curette. Improved hearing in right ear post cleaning.  - Recommended debrox for maintenance cleaning - Follow up as needed   Orpah Cobb, DO PGY-3, [facility] 09/27/2020 12:44 PM

## 2020-09-27 NOTE — Assessment & Plan Note (Addendum)
Chronic and recurrent. Successful debridement with ear solution and manual removal with curette. Improved hearing in right ear post cleaning.  - Recommended debrox for maintenance cleaning - Follow up as needed

## 2020-09-29 ENCOUNTER — Telehealth: Payer: Self-pay | Admitting: Family Medicine

## 2020-09-29 NOTE — Telephone Encounter (Signed)
Attempted to contact patient both at home number and cell number without success. Unable to leave message. Called to inform patient that he needs to schedule annual exam within next 1-2 months for cholesterol and chronic condition management.

## 2020-10-02 ENCOUNTER — Other Ambulatory Visit: Payer: Self-pay | Admitting: Family Medicine

## 2020-10-25 DIAGNOSIS — Z23 Encounter for immunization: Secondary | ICD-10-CM | POA: Diagnosis not present

## 2020-12-07 ENCOUNTER — Ambulatory Visit (INDEPENDENT_AMBULATORY_CARE_PROVIDER_SITE_OTHER): Payer: Medicare Other | Admitting: Family Medicine

## 2020-12-07 ENCOUNTER — Encounter: Payer: Self-pay | Admitting: Family Medicine

## 2020-12-07 ENCOUNTER — Other Ambulatory Visit: Payer: Self-pay | Admitting: Family Medicine

## 2020-12-07 ENCOUNTER — Other Ambulatory Visit: Payer: Self-pay

## 2020-12-07 VITALS — BP 135/80 | HR 79 | Ht 69.0 in | Wt 169.8 lb

## 2020-12-07 DIAGNOSIS — E785 Hyperlipidemia, unspecified: Secondary | ICD-10-CM

## 2020-12-07 DIAGNOSIS — Z0000 Encounter for general adult medical examination without abnormal findings: Secondary | ICD-10-CM

## 2020-12-07 DIAGNOSIS — G40909 Epilepsy, unspecified, not intractable, without status epilepticus: Secondary | ICD-10-CM

## 2020-12-07 MED ORDER — ATORVASTATIN CALCIUM 20 MG PO TABS: 20.0000 mg | ORAL_TABLET | Freq: Every day | ORAL | 3 refills | Status: DC

## 2020-12-07 NOTE — Patient Instructions (Signed)
It was good to see you today.  Thank you for coming in.  I think you have high cholesterol.      I am scheduling to obtain labs tomorrow morning.  I will also fill out your Physical paperwork and fax it to you tomorrow.  Be Well, Dr Pecola Leisure

## 2020-12-08 ENCOUNTER — Other Ambulatory Visit: Payer: Self-pay | Admitting: Family Medicine

## 2020-12-08 ENCOUNTER — Telehealth: Payer: Self-pay | Admitting: Family Medicine

## 2020-12-08 ENCOUNTER — Other Ambulatory Visit: Payer: Medicare Other

## 2020-12-08 ENCOUNTER — Other Ambulatory Visit: Payer: Self-pay

## 2020-12-08 DIAGNOSIS — E782 Mixed hyperlipidemia: Secondary | ICD-10-CM | POA: Diagnosis not present

## 2020-12-08 DIAGNOSIS — Z0000 Encounter for general adult medical examination without abnormal findings: Secondary | ICD-10-CM | POA: Diagnosis not present

## 2020-12-08 DIAGNOSIS — G40909 Epilepsy, unspecified, not intractable, without status epilepticus: Secondary | ICD-10-CM

## 2020-12-08 NOTE — Telephone Encounter (Signed)
Ronald from [employer] is calling to leave the fax number to fax patients FL2 and standing order forms.   The best fax number is [phone]

## 2020-12-09 LAB — COMPREHENSIVE METABOLIC PANEL
ALT: 16 IU/L (ref 0–44)
AST: 13 IU/L (ref 0–40)
Albumin/Globulin Ratio: 1.6 (ref 1.2–2.2)
Albumin: 4.4 g/dL (ref 3.8–4.9)
Alkaline Phosphatase: 80 IU/L (ref 44–121)
BUN/Creatinine Ratio: 10 (ref 9–20)
BUN: 12 mg/dL (ref 6–24)
Bilirubin Total: 0.3 mg/dL (ref 0.0–1.2)
CO2: 24 mmol/L (ref 20–29)
Calcium: 9.6 mg/dL (ref 8.7–10.2)
Chloride: 101 mmol/L (ref 96–106)
Creatinine, Ser: 1.16 mg/dL (ref 0.76–1.27)
GFR calc Af Amer: 83 mL/min/{1.73_m2} (ref >59)
GFR calc non Af Amer: 72 mL/min/{1.73_m2} (ref >59)
Globulin, Total: 2.8 g/dL (ref 1.5–4.5)
Glucose: 89 mg/dL (ref 65–99)
Potassium: 4.6 mmol/L (ref 3.5–5.2)
Sodium: 137 mmol/L (ref 134–144)
Total Protein: 7.2 g/dL (ref 6.0–8.5)

## 2020-12-09 LAB — LIPID PANEL
Chol/HDL Ratio: 7.2 ratio — ABNORMAL HIGH (ref 0.0–5.0)
Cholesterol, Total: 246 mg/dL — ABNORMAL HIGH (ref 100–199)
HDL: 34 mg/dL — ABNORMAL LOW (ref >39)
LDL Chol Calc (NIH): 183 mg/dL — ABNORMAL HIGH (ref 0–99)
Triglycerides: 153 mg/dL — ABNORMAL HIGH (ref 0–149)
VLDL Cholesterol Cal: 29 mg/dL (ref 5–40)

## 2020-12-09 LAB — CBC
Hematocrit: 45 % (ref 37.5–51.0)
Hemoglobin: 15 g/dL (ref 13.0–17.7)
MCH: 28.1 pg (ref 26.6–33.0)
MCHC: 33.3 g/dL (ref 31.5–35.7)
MCV: 84 fL (ref 79–97)
Platelets: 586 10*3/uL — ABNORMAL HIGH (ref 150–450)
RBC: 5.34 x10E6/uL (ref 4.14–5.80)
RDW: 12.5 % (ref 11.6–15.4)
WBC: 7.7 10*3/uL (ref 3.4–10.8)

## 2020-12-09 LAB — HCV AB W REFLEX TO QUANT PCR: HCV Ab: <0.1 {s_co_ratio} (ref 0.0–0.9)

## 2020-12-09 LAB — HCV INTERPRETATION

## 2020-12-09 NOTE — Telephone Encounter (Signed)
Great thanks, will bring by clinic to fax on Monday

## 2020-12-09 NOTE — Assessment & Plan Note (Signed)
Last Lipid panel 09/2019 and had LDL of 184.  There was discussion of starting on statin but does not appear to have been initiated.  Plan to start on statin today and obtain repeat LDL. - Obtain repeat Lipid Panel - Will start Atorvastatin 20 mg

## 2020-12-09 NOTE — Progress Notes (Signed)
    SUBJECTIVE:   Chief compliant/HPI: annual examination  Bobby Liu is a 55 y.o. who presents today for an annual exam.  He has no concerns at this time.   History tabs reviewed and updated for hyperlipidemia.  Patient has previously elevated LDL to 170's-180's.  Was previously discussed with patient starting a Statin but had not been initiated in past.  Review of systems form reviewed and notable for no chest pain, difficulty breathing, n/v/d.   OBJECTIVE:   BP 135/80   Pulse 79   Ht 5\' 9"  (1.753 m)   Wt 169 lb 12.8 oz (77 kg)   SpO2 97%   BMI 25.08 kg/m    Physical Exam Constitutional:      General: He is not in acute distress.    Appearance: He is not ill-appearing.  HENT:     Head: Normocephalic and atraumatic.     Nose: Nose normal.     Mouth/Throat:     Mouth: Mucous membranes are moist.     Pharynx: No oropharyngeal exudate or posterior oropharyngeal erythema.  Eyes:     Extraocular Movements: Extraocular movements intact.     Pupils: Pupils are equal, round, and reactive to light.  Cardiovascular:     Rate and Rhythm: Normal rate and regular rhythm.     Pulses: Normal pulses.  Pulmonary:     Effort: Pulmonary effort is normal.     Breath sounds: Normal breath sounds.  Abdominal:     General: Abdomen is flat. There is no distension.     Palpations: Abdomen is soft.     Tenderness: There is no abdominal tenderness.  Musculoskeletal:        General: No swelling or tenderness. Normal range of motion.     Cervical back: No deformity.     Thoracic back: No deformity. Normal range of motion.     Lumbar back: No deformity. Normal range of motion.  Neurological:     General: No focal deficit present.     Mental Status: He is alert and oriented to person, place, and time.     Motor: No weakness.     Coordination: Coordination normal.     Gait: Gait normal.     Deep Tendon Reflexes: Reflexes normal.  Psychiatric:        Mood and Affect: Mood normal.         Behavior: Behavior normal.     ASSESSMENT/PLAN:   Hyperlipidemia Last Lipid panel 09/2019 and had LDL of 184.  There was discussion of starting on statin but does not appear to have been initiated.  Plan to start on statin today and obtain repeat LDL. - Obtain repeat Lipid Panel - Will start Atorvastatin 20 mg          Annual Examination  See AVS for age appropriate recommendations.  Blood pressure value is at goal.  CBC & CMP due to patient taking Lamcital for seizures.   Hepatitis C: ordered Colorectal cancer screening: Discussed, will have return visit in 3 months for discussion about obtaining colonoscopy.  Follow up in 3 months for discussion of colonoscopy/ further health management.   10/2019, MD [facility]

## 2020-12-09 NOTE — Addendum Note (Signed)
Addended by: Manson Passey, CARINA on: 12/09/2020 06:53 PM   Modules accepted: Level of Service

## 2021-01-05 DIAGNOSIS — F429 Obsessive-compulsive disorder, unspecified: Secondary | ICD-10-CM | POA: Diagnosis not present

## 2021-01-05 DIAGNOSIS — F840 Autistic disorder: Secondary | ICD-10-CM | POA: Diagnosis not present

## 2021-01-05 DIAGNOSIS — F422 Mixed obsessional thoughts and acts: Secondary | ICD-10-CM | POA: Diagnosis not present

## 2021-01-30 ENCOUNTER — Other Ambulatory Visit: Payer: Self-pay

## 2021-01-30 ENCOUNTER — Ambulatory Visit (INDEPENDENT_AMBULATORY_CARE_PROVIDER_SITE_OTHER): Payer: Medicare Other | Admitting: Student in an Organized Health Care Education/Training Program

## 2021-01-30 DIAGNOSIS — R21 Rash and other nonspecific skin eruption: Secondary | ICD-10-CM | POA: Diagnosis not present

## 2021-01-30 MED ORDER — TRIAMCINOLONE ACETONIDE 0.1 % EX OINT: 1.0000 "application " | TOPICAL_OINTMENT | Freq: Two times a day (BID) | CUTANEOUS | 0 refills | Status: DC

## 2021-01-30 NOTE — Patient Instructions (Signed)
It was a pleasure to see you today!  To summarize our discussion for this visit:  Your rash appears to be a sensitivity reaction like your previous contact dermatitis. I am prescribing a topical steroid to help with the itching and hopefully resolve the rash.   If it is not getting better in a week, please come back to reevaluate other causes of the rash.  Some additional health maintenance measures we should update are: Health Maintenance Due  Topic Date Due  . COLONOSCOPY (Pts 45-67yrs Insurance coverage will need to be confirmed)  Never done  . TETANUS/TDAP  10/04/2017  . INFLUENZA VACCINE  06/11/2020  . COVID-19 Vaccine (3 - Booster for Moderna series) 08/13/2020  .    Call the clinic at [phone] if your symptoms worsen or you have any concerns.   Thank you for allowing me to take part in your care,  Dr. Jamelle Rushing

## 2021-01-30 NOTE — Progress Notes (Signed)
   SUBJECTIVE:   CHIEF COMPLAINT / HPI: rash on neck  Approximately a week of rash on anterior neck which has not changed since onset. It is not located anywhere else on body. Feels itchy and patient has been scratching excessively. Describes flaking skin. No bleeding or drainage. Tried Lubriderm without much relief. Denies any new skin products, soaps, clothing, excessive sun exposure, etc. He is a dishwasher at [employer] and believes that the rash started after getting steam from the dishwasher last week and frequently. Has no history of the same. H/o contact dermatitis in remote history.  OBJECTIVE:   BP 122/64   Pulse 82   Ht 5\' 9"  (1.753 m)   Wt 168 lb 12.8 oz (76.6 kg)   SpO2 96%   BMI 24.93 kg/m   Physical Exam Vitals and nursing note reviewed.  Constitutional:      Appearance: He is normal weight.  HENT:     Head: Normocephalic.     Ears:     Comments: External ears bilaterally positive for dry, non-erythematous flaking skin. TMs/canals normal. Neck:     Thyroid: No thyromegaly.     Trachea: Trachea normal.  Musculoskeletal:     Cervical back: Normal range of motion and neck supple. Erythema present. No edema. No pain with movement or muscular tenderness.  Lymphadenopathy:     Cervical: No cervical adenopathy.  Skin:    General: Skin is warm and dry.     Findings: Rash present.     Comments: Erythematous and flaking rash on anterior neck from submandibular line down to the neckline of his shirt. There is no rash present on posterior neck, below shirt level or on hands. Non-tender to palpation. Multiple excoriations present. No signs of secondary bacterial infection at this time.  Neurological:     Mental Status: He is alert.    ASSESSMENT/PLAN:   Rash Suspect contact dermatitis from steam or contents in steam at work given distribution and no spreading past the areas of skin that were protected from clothing.  Patient has no recent changes in other medications,  topicals. Denies anything more than just passive sun exposure when walking from indoors to vehicle and back. He's got no systemic symptoms such as fever or lymphadenopathy. Prescribed triamcinolone x7 days and follow up if not resolving with that treatment.  Discouraged him from scratching to prevent infection. Would recommend considering a scraping or shave biopsy at follow up if not resolved with steroid.     , DO [facility]

## 2021-01-31 NOTE — Assessment & Plan Note (Signed)
Suspect contact dermatitis from steam or contents in steam at work given distribution and no spreading past the areas of skin that were protected from clothing.  Patient has no recent changes in other medications, topicals. Denies anything more than just passive sun exposure when walking from indoors to vehicle and back. He's got no systemic symptoms such as fever or lymphadenopathy. Prescribed triamcinolone x7 days and follow up if not resolving with that treatment.  Discouraged him from scratching to prevent infection. Would recommend considering a scraping or shave biopsy at follow up if not resolved with steroid.

## 2021-03-12 ENCOUNTER — Other Ambulatory Visit: Payer: Self-pay | Admitting: Family Medicine

## 2021-03-28 DIAGNOSIS — F840 Autistic disorder: Secondary | ICD-10-CM | POA: Diagnosis not present

## 2021-03-28 DIAGNOSIS — F422 Mixed obsessional thoughts and acts: Secondary | ICD-10-CM | POA: Diagnosis not present

## 2021-03-28 DIAGNOSIS — F429 Obsessive-compulsive disorder, unspecified: Secondary | ICD-10-CM | POA: Diagnosis not present

## 2021-05-15 ENCOUNTER — Other Ambulatory Visit: Payer: Self-pay | Admitting: Family Medicine

## 2021-07-03 DIAGNOSIS — F840 Autistic disorder: Secondary | ICD-10-CM | POA: Diagnosis not present

## 2021-07-03 DIAGNOSIS — F422 Mixed obsessional thoughts and acts: Secondary | ICD-10-CM | POA: Diagnosis not present

## 2021-07-03 DIAGNOSIS — F429 Obsessive-compulsive disorder, unspecified: Secondary | ICD-10-CM | POA: Diagnosis not present

## 2021-08-30 DIAGNOSIS — Z23 Encounter for immunization: Secondary | ICD-10-CM | POA: Diagnosis not present

## 2021-09-27 DIAGNOSIS — F429 Obsessive-compulsive disorder, unspecified: Secondary | ICD-10-CM | POA: Diagnosis not present

## 2021-09-27 DIAGNOSIS — F422 Mixed obsessional thoughts and acts: Secondary | ICD-10-CM | POA: Diagnosis not present

## 2021-09-27 DIAGNOSIS — F840 Autistic disorder: Secondary | ICD-10-CM | POA: Diagnosis not present

## 2021-10-16 ENCOUNTER — Other Ambulatory Visit: Payer: Self-pay

## 2021-10-17 MED ORDER — ATORVASTATIN CALCIUM 20 MG PO TABS: 20.0000 mg | ORAL_TABLET | Freq: Every day | ORAL | 3 refills | Status: DC

## 2021-12-25 ENCOUNTER — Encounter: Payer: Self-pay | Admitting: Family Medicine

## 2021-12-25 ENCOUNTER — Other Ambulatory Visit: Payer: Self-pay

## 2021-12-25 ENCOUNTER — Ambulatory Visit (INDEPENDENT_AMBULATORY_CARE_PROVIDER_SITE_OTHER): Payer: Medicare Other | Admitting: Family Medicine

## 2021-12-25 VITALS — BP 118/88 | HR 77 | Ht 69.0 in | Wt 175.6 lb

## 2021-12-25 DIAGNOSIS — Z23 Encounter for immunization: Secondary | ICD-10-CM

## 2021-12-25 DIAGNOSIS — Z1211 Encounter for screening for malignant neoplasm of colon: Secondary | ICD-10-CM | POA: Diagnosis not present

## 2021-12-25 DIAGNOSIS — E785 Hyperlipidemia, unspecified: Secondary | ICD-10-CM

## 2021-12-25 NOTE — Patient Instructions (Signed)
It was great seeing you today!  Today we did a complete physical, everything looks good.   We will get blood work to check your cholesterol, please continue to take your lipitor.   You are due for a colonoscopy screening, I have placed a GI referral for this to be performed, they should be in contact with you soon. Please contact our office if you do not hear from them in 2 weeks so we can assist with scheduling.   Please follow up at your next scheduled appointment, if anything arises between now and then, please don't hesitate to contact our office.   Thank you for allowing [place] to be a part of your medical care!  Thank you, Dr. Robyne Peers

## 2021-12-25 NOTE — Assessment & Plan Note (Addendum)
-  continue statin -lipid panel obtained, will notify patient of any abnormal results  -diet and exercise counseling provided

## 2021-12-25 NOTE — Progress Notes (Signed)
° ° °  SUBJECTIVE:   CHIEF COMPLAINT / HPI:   Patient presents for physical. He is needing paperwork completed. Denies any concerns at this time.   HLD Compliant on lipitor daily. Tolerating medication well, denies myalgias. Eats vegetables with most of his meals. Goes to the [facility] on non-work daily to exercise, uses the treadmill or walks the track. Used to participate in [employer].   OBJECTIVE:   BP 118/88    Pulse 77    Ht 5\' 9"  (1.753 m)    Wt 175 lb 9.6 oz (79.7 kg)    SpO2 99%    BMI 25.93 kg/m   General: Patient well-appearing, in no acute distress. HEENT: PERRLA, non-tender thyroid, no evidence of cervical LAD CV: RRR, no murmurs or gallops auscultated Resp: CTAB, no wheezing, rales or rhonchi Abdomen: soft, nontender, nondistended, presence of bowel sounds\ Ext: radial and distal pulses strong and equal bilaterally, no LE edema noted bilaterally Psych: mood appropriate   ASSESSMENT/PLAN:   Hyperlipidemia -continue statin -lipid panel obtained, will notify patient of any abnormal results  -diet and exercise counseling provided    Annual wellness exam -Discussed importance of colonoscopy screening. Conveyed that this is highly recommended. GI referral placed. -Influenza vaccine administered today per patient request, tolerating well without immediate complications  -Patient needs paperwork completed for adult living facility, will notify patient when paperwork has been completed and is ready for pickup, patient voices understanding  -PHQ-9 score of 0 reviewed.   , DO [facility]

## 2021-12-26 LAB — LIPID PANEL
Chol/HDL Ratio: 5.4 ratio — ABNORMAL HIGH (ref 0.0–5.0)
Cholesterol, Total: 195 mg/dL (ref 100–199)
HDL: 36 mg/dL — ABNORMAL LOW (ref >39)
LDL Chol Calc (NIH): 113 mg/dL — ABNORMAL HIGH (ref 0–99)
Triglycerides: 267 mg/dL — ABNORMAL HIGH (ref 0–149)
VLDL Cholesterol Cal: 46 mg/dL — ABNORMAL HIGH (ref 5–40)

## 2021-12-27 ENCOUNTER — Encounter: Payer: Self-pay | Admitting: Family Medicine

## 2021-12-27 ENCOUNTER — Other Ambulatory Visit: Payer: Self-pay | Admitting: Family Medicine

## 2021-12-27 DIAGNOSIS — E785 Hyperlipidemia, unspecified: Secondary | ICD-10-CM

## 2021-12-27 MED ORDER — ATORVASTATIN CALCIUM 40 MG PO TABS: 40.0000 mg | ORAL_TABLET | Freq: Every day | ORAL | 3 refills | Status: DC

## 2022-01-21 ENCOUNTER — Telehealth: Payer: Self-pay | Admitting: Family Medicine

## 2022-01-21 NOTE — Telephone Encounter (Signed)
Caregiver came in to pick-up Adult Care Home FL2 form and stated the form was not signed nor was the medication listed on the form. Numbers 16 to 21 need to be completed. Form placed in blue team folder   ?

## 2022-01-22 NOTE — Telephone Encounter (Signed)
Clinical info completed on FML2 form.  Place form in PCPs box for completion.  Lindi Abram, CMA  

## 2022-01-24 NOTE — Telephone Encounter (Signed)
Form faxed to [employer] and a copy was made for scanning.  ?

## 2022-01-24 NOTE — Telephone Encounter (Signed)
Patient is calling to leave the best fax number for his FL2 to be sent to. It should be sent to [employer] at [phone]. ?

## 2022-03-14 ENCOUNTER — Other Ambulatory Visit: Payer: Self-pay

## 2022-03-15 MED ORDER — LAMOTRIGINE 200 MG PO TABS: ORAL_TABLET | ORAL | 2 refills | Status: DC

## 2022-03-21 ENCOUNTER — Other Ambulatory Visit: Payer: Self-pay | Admitting: Family Medicine

## 2022-03-21 ENCOUNTER — Telehealth: Payer: Self-pay

## 2022-03-21 DIAGNOSIS — Z23 Encounter for immunization: Secondary | ICD-10-CM

## 2022-03-21 NOTE — Telephone Encounter (Signed)
Vm left informing patient of prescription to pharmacy.  ?

## 2022-03-21 NOTE — Telephone Encounter (Signed)
Patient calls nurse line requesting a prescription for Shingrix. ? ?Pleas send to [facility] on [address] ?

## 2022-04-16 ENCOUNTER — Encounter: Payer: Self-pay | Admitting: *Deleted

## 2022-12-13 ENCOUNTER — Other Ambulatory Visit: Payer: Self-pay

## 2022-12-13 DIAGNOSIS — E785 Hyperlipidemia, unspecified: Secondary | ICD-10-CM

## 2022-12-13 MED ORDER — ATORVASTATIN CALCIUM 40 MG PO TABS: 40.0000 mg | ORAL_TABLET | Freq: Every day | ORAL | 3 refills | Status: DC

## 2022-12-13 MED ORDER — LAMOTRIGINE 200 MG PO TABS: ORAL_TABLET | ORAL | 2 refills | Status: DC

## 2023-01-30 ENCOUNTER — Encounter: Payer: Self-pay | Admitting: Family Medicine

## 2023-01-30 ENCOUNTER — Ambulatory Visit (INDEPENDENT_AMBULATORY_CARE_PROVIDER_SITE_OTHER): Payer: 59 | Admitting: Family Medicine

## 2023-01-30 VITALS — BP 116/81 | HR 84 | Ht 69.0 in | Wt 171.0 lb

## 2023-01-30 DIAGNOSIS — E785 Hyperlipidemia, unspecified: Secondary | ICD-10-CM | POA: Diagnosis not present

## 2023-01-30 DIAGNOSIS — Z1211 Encounter for screening for malignant neoplasm of colon: Secondary | ICD-10-CM | POA: Diagnosis not present

## 2023-01-30 DIAGNOSIS — Z23 Encounter for immunization: Secondary | ICD-10-CM | POA: Diagnosis not present

## 2023-01-30 NOTE — Progress Notes (Signed)
    SUBJECTIVE:   CHIEF COMPLAINT / HPI:   Patient presents for annual physical. Denies any concerns today other than needing some paperwork filled out. He is fine with this being completed at a later time. Denies tobacco, drug or alcohol use. Denies any new partners. Taking lamictal for seizure disorder. Prozac is prescribed by [facility] who he follows up with every 3 months. He is agreeable to getting blood work today. States that he has been doing well.   OBJECTIVE:   BP 116/81   Pulse 84   Ht 5\' 9"  (1.753 m)   Wt 171 lb (77.6 kg)   SpO2 100%   BMI 25.25 kg/m   General: Patient well-appearing, in no acute distress. HEENT: PERRLA, normal buccal mucosa, non-tender thyroid, no evidence of cervical LAD CV: RRR, no murmurs or gallops auscultated Resp: CTAB, no wheezing, rales or rhonchi  Abdomen: soft, nontender, nondistended, presence of bowel sounds Ext: no LE edema noted bilaterally Psych: mood appropriate   ASSESSMENT/PLAN:   Depression -PHQ-9 score of 0 reviewed.  -continue prozac per psychiatry  Hyperlipidemia -continue atorvastatin daily -pending lipid panel  Health maintenance  -Med rec reviewed and updated appropriately  -Discussed importance of colon cancer screening, patient agreeable to colonoscopy. GI referral placed. -PHQ-9 score of 0 reviewed.    Donney Dice, [place]

## 2023-01-30 NOTE — Assessment & Plan Note (Signed)
-  continue atorvastatin daily -pending lipid panel

## 2023-01-30 NOTE — Patient Instructions (Addendum)
It was great seeing you today!  I am glad that you are doing well! Please continue to take your atorvastatin daily, we will check your cholesterol today and I will let you know of any abnormal results. I will also let you know when the paperwork is completed.   We discussed the importance of colon cancer screening, I have placed a referral to the GI specialist to have this done at your convenience. They should be in contact with you within the next few weeks.   Please follow up at your next scheduled appointment in 1 year, if anything arises between now and then, please don't hesitate to contact our office.   Thank you for allowing [place] to be a part of your medical care!  Thank you, Dr. Larae Grooms  Also a reminder of our clinic's no-show policy. Please make sure to arrive at least 15 minutes prior to your scheduled appointment time. Please try to cancel before 24 hours if you are not able to make it. If you no-show for 2 appointments then you will be receiving a warning letter. If you no-show after 3 visits, then you may be at risk of being dismissed from our clinic. This is to ensure that everyone is able to be seen in a timely manner. Thank you, we appreciate your assistance with this!

## 2023-01-30 NOTE — Assessment & Plan Note (Signed)
-  PHQ-9 score of 0 reviewed.  -continue prozac per psychiatry

## 2023-01-31 ENCOUNTER — Telehealth: Payer: Self-pay | Admitting: Family Medicine

## 2023-01-31 LAB — LIPID PANEL
Chol/HDL Ratio: 4.6 ratio (ref 0.0–5.0)
Cholesterol, Total: 166 mg/dL (ref 100–199)
HDL: 36 mg/dL — ABNORMAL LOW (ref >39)
LDL Chol Calc (NIH): 104 mg/dL — ABNORMAL HIGH (ref 0–99)
Triglycerides: 149 mg/dL (ref 0–149)
VLDL Cholesterol Cal: 26 mg/dL (ref 5–40)

## 2023-01-31 NOTE — Telephone Encounter (Signed)
Attached office visit to patients physical form. Placed up front for pick up.   Called and lvm letting patient know they are ready.

## 2023-02-05 NOTE — Progress Notes (Unsigned)
I connected with  Layla Maw on 02/06/2023 by a audio enabled telemedicine application and verified that I am speaking with the correct person using two identifiers.  Patient Location: Home  Provider Location: Home Office  I discussed the limitations of evaluation and management by telemedicine. The patient expressed understanding and agreed to proceed.   Subjective:   Bobby Liu is a 57 y.o. male who presents for Medicare Annual/Subsequent preventive examination.  Review of Systems    Per HPI unless specifically indicated below. Cardiac Risk Factors include: advanced age (>60men, >51 women);male gender,and Hyperlipidemia.          Objective:       01/30/2023   11:13 AM 12/25/2021    2:20 PM 01/30/2021   10:59 AM  Vitals with BMI  Height 5\' 9"  5\' 9"  5\' 9"   Weight 171 lbs 175 lbs 10 oz 168 lbs 13 oz  BMI 25.24 123456 [id number]  Systolic [id number]  Diastolic 81 88 64  Pulse 84 77 82    Today's Vitals   02/06/23 1108  PainSc: 0-No pain   There is no height or weight on file to calculate BMI.     01/30/2023   11:12 AM 12/25/2021    2:21 PM 09/28/2019    3:46 PM 04/14/2019    3:01 PM 01/05/2019    9:06 AM 10/23/2017    9:27 AM 01/16/2017    9:14 AM  Advanced Directives  Does Patient Have a Medical Advance Directive? No No No No No No No  Would patient like information on creating a medical advance directive? No - Patient declined No - Patient declined No - Patient declined No - Patient declined No - Patient declined No - Patient declined No - Patient declined    Current Medications (verified) Outpatient Encounter Medications as of 02/06/2023  Medication Sig   atorvastatin (LIPITOR) 40 MG tablet Take 1 tablet (40 mg total) by mouth daily.   FLUoxetine (PROZAC) 20 MG capsule TAKE 1 CAPSULE BY MOUTH EVERY MORNING (BLUE AND GREEN CAPSULE WITH SG 114)   lamoTRIgine (LAMICTAL) 200 MG tablet TAKE 1 TABLET BY MOUTH ONCE DAILY (BLUE TRIANGLE SHAPED TABLET  WITH UU 114)   triamcinolone ointment (KENALOG) 0.1 % Apply 1 application topically 2 (two) times daily. (Patient not taking: Reported on 02/06/2023)   No facility-administered encounter medications on file as of 02/06/2023.    Allergies (verified) Phenobarbital   History: Past Medical History:  Diagnosis Date   Autism    Depression    Hyperlipidemia    OCD (obsessive compulsive disorder)    Seizures ([place])    Last seizure 2008. 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 Sees Dr Gloriann Loan at [facility]   Past Surgical History:  Procedure Laterality Date   TOOTH EXTRACTION     History reviewed. No pertinent family history. 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 Parents divorced.  Sees dad every 3-4 months.  No relationship with mom.  Raised by father/maternal GM.  Grandma passed away 06-25-09.  One brother in good health.  Sister in good health.      Athlete in [place] team: plays soccer, basketball).      Lives in [place] group home with Edd Arbour, wife and family, 2 other clients. No pets. Smoke alarms present. No tripping hazards. Grab bars in restroom.      Reports no seizures since 1999. Stable on Lamictal. Very high functioning autistic patient with OCD.   Social Determinants of Health   Financial Resource Strain: Low Risk  (01/05/2019)   Overall Financial Resource  Strain (CARDIA)    Difficulty of Paying Living Expenses: Not hard at all  Food Insecurity: No Food Insecurity (02/06/2023)   Hunger Vital Sign    Worried About Running Out of Food in the Last Year: Never true    Ran Out of Food in the Last Year: Never true  Transportation Needs: No Transportation Needs (02/06/2023)   PRAPARE - Hydrologist (Medical): No    Lack of Transportation (Non-Medical): No  Physical Activity: Insufficiently Active (02/06/2023)   Exercise Vital Sign    Days of Exercise per Week: 1 day    Minutes of Exercise per Session: 30 min  Stress: No Stress Concern Present (02/06/2023)   [place]    Feeling of Stress : Not at all  Social Connections: Socially Isolated (02/06/2023)   Social Connection and Isolation Panel [NHANES]    Frequency of Communication with Friends and Family: Once a week    Frequency of Social Gatherings with Friends and Family: Three times a week    Attends Religious Services: Never    Active Member of Clubs or Organizations: No    Attends Archivist Meetings: Never    Marital Status: Never married    Tobacco Counseling Counseling given: No   Clinical Intake:  Pre-visit preparation completed: No  Pain : No/denies pain Pain Score: 0-No pain     Nutritional Status: BMI 25 -29 Overweight Nutritional Risks: None Diabetes: No  How often do you need to have someone help you when you read instructions, pamphlets, or other written materials from your doctor or pharmacy?: 1 - Never  Diabetic?No  Interpreter Needed?: No  Information entered by :: Donnie Mesa, CMA   Activities of Daily Living    02/06/2023   11:06 AM  In your present state of health, do you have any difficulty performing the following activities:  Hearing? 0  Vision? [place]  Difficulty concentrating or making decisions? 0   Walking or climbing stairs? 0  Dressing or bathing? 0  Doing errands, shopping? 0    Patient Care Team: Donney Dice, DO as PCP - General (Family Medicine) Cameron Sprang, MD as Consulting Physician (Neurology) [facility]) [facility] Simmonds and [facility] (Dentistry) [place] (Optometry)  Indicate any recent Medical Services you may have received from other than [facility] providers in the past year (date may be approximate).     Assessment:   This is a routine wellness examination for [place].   Hearing/Vision screen Denies any hearing issues. Denies any change to her vision. Wear glasses. Annual [place] .  Dietary issues and exercise activities discussed: Current Exercise Habits: Structured exercise class, Time (Minutes): 20, Frequency (Times/Week): 1, Weekly Exercise (Minutes/Week): 20, Intensity: Mild, Exercise limited by: None identified   Goals Addressed   None    Depression Screen    02/06/2023   11:05 AM 01/30/2023   11:13 AM 12/25/2021    2:21 PM 12/07/2020    4:11 PM 09/27/2020   11:48 AM 09/28/2019    3:46 PM 04/14/2019    3:01 PM  PHQ 2/9 Scores  PHQ - 2 Score 0 0 0 0 0 0 0  PHQ- 9 Score  0 0 0 0      Fall Risk    02/06/2023   11:06 AM 09/27/2020   11:48 AM 09/28/2019    3:45 PM 04/14/2019    3:01 PM 01/05/2019    9:07 AM  [place] in the past year? 0 0 0 0 0  Number falls in past yr: 0 0 0 0   Injury with Fall? 0 0     Risk for fall due to : No Fall Risks    Mental status change  Follow up Falls evaluation completed    Falls prevention discussed    FALL RISK PREVENTION PERTAINING TO THE HOME:  Any stairs in or around the home? Yes  If so, are there any without handrails? No  Home free of loose throw rugs in walkways, pet beds, electrical cords, etc? Yes  Adequate lighting in your home to reduce risk of falls? Yes   ASSISTIVE DEVICES UTILIZED TO PREVENT FALLS:  Life alert? No  Use of a cane, walker or  w/c? No  Grab bars in the bathroom? No  Shower chair or bench in shower? No  Elevated toilet seat or a handicapped toilet? No   TIMED UP AND GO:  Was the test performed? Unable to perform, virtual appointment   Cognitive Function:    01/05/2019    9:10 AM  MMSE - Mini Mental State Exam  Orientation to time 5  Orientation to Place 5  Registration 3  Attention/ Calculation 5  Recall 3  Language- name 2 objects 2  Language- repeat 1  Language- follow 3 step command 3  Language- read & follow direction 1  Write a sentence 1  Copy design 1  Total score 30        02/06/2023   11:07 AM 01/05/2019    9:10 AM  6CIT Screen  What Year? 0 points 0 points  What month? 0 points 0 points  What time? 0 points 0 points  Count back from 20 0 points 0 points  Months in reverse 0 points 0 points  Repeat phrase 0 points   Total Score 0 points     Immunizations Immunization History  Administered Date(s) Administered   COVID-19, mRNA, vaccine(Comirnaty)12 years and older 01/30/2023   Influenza Whole 08/21/2009   Influenza,inj,Quad PF,6+ Mos 07/19/2013, 10/23/2017, 12/08/2018, 12/25/2021, 01/30/2023   Influenza-Unspecified 08/11/2019   Moderna SARS-COV2 Booster Vaccination 08/30/2021   Moderna Sars-Covid-2 Vaccination 01/09/2020, 02/12/2020, 10/25/2020   Td 10/05/2007    TDAP status: Up to date  Flu Vaccine status: Up to date  Pneumococcal Vaccine: Not applicable  0000000 vaccine status: Completed vaccines  Qualifies for Shingles Vaccine? Yes   Zostavax completed No   Shingrix Completed?: No.    Education has been provided regarding the importance of this vaccine. Patient has been advised to call insurance company to determine out of pocket expense  if they have not yet received this vaccine. Advised may also receive vaccine at local pharmacy or Health Dept. Verbalized acceptance and understanding.  Screening Tests Health Maintenance  Topic Date Due   COLONOSCOPY (Pts  45-79yrs Insurance coverage will need to be confirmed)  Never done   DTaP/Tdap/Td (2 - Tdap) 10/04/2017   Zoster Vaccines- Shingrix (1 of 2) Never done   Medicare Annual Wellness (AWV)  02/06/2024   INFLUENZA VACCINE  Completed   COVID-19 Vaccine  Completed   Hepatitis C Screening  Completed   HIV Screening  Completed   HPV VACCINES  Aged Out    Health Maintenance  Health Maintenance Due  Topic Date Due   COLONOSCOPY (Pts 45-21yrs Insurance coverage will need to be confirmed)  Never done   DTaP/Tdap/Td (2 - Tdap) 10/04/2017   Zoster Vaccines- Shingrix (1 of 2) Never done    Colorectal Cancer Screening: the pt state that his caregiver Jori Moll has to help with scheduling this appt for transportation purposes.  Lung Cancer Screening: (Low Dose CT Chest recommended if Age 33-80 years, 30 pack-year currently smoking OR have quit w/in 15years.) does not qualify.   Lung Cancer Screening Referral: not applicable   Additional Screening:  Hepatitis C Screening: does qualify; Completed 12/08/2020  Vision Screening: Recommended annual ophthalmology exams for early detection of glaucoma and other disorders of the eye. Is the patient up to date with their annual eye exam?  Yes  Who is the provider or what is the name of the office in which the patient attends annual eye exams? [place]  If pt is not established with a provider, would they like to be referred to a provider to establish care? No .   Dental Screening: Recommended annual dental exams for proper oral hygiene  Community Resource Referral / Chronic Care Management: CRR required this visit?  No   CCM required this visit?  No      Plan:     I have personally reviewed and noted the following in the patient's chart:   Medical and social history Use of alcohol, tobacco or illicit drugs  Current medications and supplements including opioid prescriptions. Patient is not currently taking opioid  prescriptions. Functional ability and status Nutritional status Physical activity Advanced directives List of other physicians Hospitalizations, surgeries, and ER visits in previous 12 months Vitals Screenings to include cognitive, depression, and falls Referrals and appointments  In addition, I have reviewed and discussed with patient certain preventive protocols, quality metrics, and best practice recommendations. A written personalized care plan for preventive services as well as general preventive health recommendations were provided to patient.    Mr. Mcnamara , Thank you for taking time to come for your Medicare Wellness Visit. I appreciate your ongoing commitment to your health goals. Please review the following plan we discussed and let me know if I can assist you in the future.   These are the goals we discussed:  Goals      Patient Stated     Stay active         This is a list of the screening recommended for you and due dates:  Health Maintenance  Topic Date Due   Colon Cancer Screening  Never done   DTaP/Tdap/Td vaccine (2 - Tdap) 10/04/2017   Zoster (Shingles) Vaccine (1 of 2) Never done   Medicare Annual Wellness Visit  02/06/2024   Flu Shot  Completed   COVID-19 Vaccine  Completed   Hepatitis  C Screening: USPSTF Recommendation to screen - Ages 47-79 yo.  Completed   HIV Screening  Completed   HPV Vaccine  Aged [address] [place]   02/06/2023  Nurse Notes: Approximately 30 minute Non-Face -To-Face Medicare Wellness Visit      I have reviewed this visit and agree with the documentation.  [place]

## 2023-02-05 NOTE — Patient Instructions (Signed)
Health Maintenance, Male Adopting a healthy lifestyle and getting preventive care are important in promoting health and wellness. Ask your health care provider about: The right schedule for you to have regular tests and exams. Things you can do on your own to prevent diseases and keep yourself healthy. What should I know about diet, weight, and exercise? Eat a healthy diet  Eat a diet that includes plenty of vegetables, fruits, low-fat dairy products, and lean protein. Do not eat a lot of foods that are high in solid fats, added sugars, or sodium. Maintain a healthy weight Body mass index (BMI) is a measurement that can be used to identify possible weight problems. It estimates body fat based on height and weight. Your health care provider can help determine your BMI and help you achieve or maintain a healthy weight. Get regular exercise Get regular exercise. This is one of the most important things you can do for your health. Most adults should: Exercise for at least 150 minutes each week. The exercise should increase your heart rate and make you sweat (moderate-intensity exercise). Do strengthening exercises at least twice a week. This is in addition to the moderate-intensity exercise. Spend less time sitting. Even light physical activity can be beneficial. Watch cholesterol and blood lipids Have your blood tested for lipids and cholesterol at 55 years of age, then have this test every 5 years. You may need to have your cholesterol levels checked more often if: Your lipid or cholesterol levels are high. You are older than 55 years of age. You are at high risk for heart disease. What should I know about cancer screening? Many types of cancers can be detected early and may often be prevented. Depending on your health history and family history, you may need to have cancer screening at various ages. This may include screening for: Colorectal cancer. Prostate cancer. Skin cancer. Lung  cancer. What should I know about heart disease, diabetes, and high blood pressure? Blood pressure and heart disease High blood pressure causes heart disease and increases the risk of stroke. This is more likely to develop in people who have high blood pressure readings or are overweight. Talk with your health care provider about your target blood pressure readings. Have your blood pressure checked: Every 3-5 years if you are 18-39 years of age. Every year if you are 40 years old or older. If you are between the ages of 65 and 75 and are a current or former smoker, ask your health care provider if you should have a one-time screening for abdominal aortic aneurysm (AAA). Diabetes Have regular diabetes screenings. This checks your fasting blood sugar level. Have the screening done: Once every three years after age 45 if you are at a normal weight and have a low risk for diabetes. More often and at a younger age if you are overweight or have a high risk for diabetes. What should I know about preventing infection? Hepatitis B If you have a higher risk for hepatitis B, you should be screened for this virus. Talk with your health care provider to find out if you are at risk for hepatitis B infection. Hepatitis C Blood testing is recommended for: Everyone born from 1945 through 1965. Anyone with known risk factors for hepatitis C. Sexually transmitted infections (STIs) You should be screened each year for STIs, including gonorrhea and chlamydia, if: You are sexually active and are younger than 55 years of age. You are older than 55 years of age and your   health care provider tells you that you are at risk for this type of infection. Your sexual activity has changed since you were last screened, and you are at increased risk for chlamydia or gonorrhea. Ask your health care provider if you are at risk. Ask your health care provider about whether you are at high risk for HIV. Your health care provider  may recommend a prescription medicine to help prevent HIV infection. If you choose to take medicine to prevent HIV, you should first get tested for HIV. You should then be tested every 3 months for as long as you are taking the medicine. Follow these instructions at home: Alcohol use Do not drink alcohol if your health care provider tells you not to drink. If you drink alcohol: Limit how much you have to 0-2 drinks a day. Know how much alcohol is in your drink. In the [place] one drink equals one 12 oz bottle of beer (355 mL), one 5 oz glass of wine (148 mL), or one 1 oz glass of hard liquor (44 mL). Lifestyle Do not use any products that contain nicotine or tobacco. These products include cigarettes, chewing tobacco, and vaping devices, such as e-cigarettes. If you need help quitting, ask your health care provider. Do not use street drugs. Do not share needles. Ask your health care provider for help if you need support or information about quitting drugs. General instructions Schedule regular health, dental, and eye exams. Stay current with your vaccines. Tell your health care provider if: You often feel depressed. You have ever been abused or do not feel safe at home. Summary Adopting a healthy lifestyle and getting preventive care are important in promoting health and wellness. Follow your health care provider's instructions about healthy diet, exercising, and getting tested or screened for diseases. Follow your health care provider's instructions on monitoring your cholesterol and blood pressure. This information is not intended to replace advice given to you by your health care provider. Make sure you discuss any questions you have with your health care provider. Document Revised: 03/19/2021 Document Reviewed: 03/19/2021 Elsevier Patient Education  2023 Elsevier Inc.  

## 2023-02-06 ENCOUNTER — Ambulatory Visit (INDEPENDENT_AMBULATORY_CARE_PROVIDER_SITE_OTHER): Payer: 59

## 2023-02-06 DIAGNOSIS — Z0000 Encounter for general adult medical examination without abnormal findings: Secondary | ICD-10-CM

## 2023-03-11 ENCOUNTER — Encounter: Payer: Self-pay | Admitting: Gastroenterology

## 2023-03-20 ENCOUNTER — Ambulatory Visit (AMBULATORY_SURGERY_CENTER): Payer: 59

## 2023-03-20 VITALS — Ht 69.0 in | Wt 171.0 lb

## 2023-03-20 DIAGNOSIS — Z1211 Encounter for screening for malignant neoplasm of colon: Secondary | ICD-10-CM

## 2023-03-20 MED ORDER — PEG 3350-KCL-NA BICARB-NACL 420 G PO SOLR: 4000.0000 mL | Freq: Once | ORAL | 0 refills | Status: AC

## 2023-03-20 NOTE — Progress Notes (Signed)
No egg or soy allergy known to patient  No issues known to pt with past sedation with any surgeries or procedures Patient denies ever being told they had issues or difficulty with intubation  No FH of Malignant Hyperthermia Pt is not on diet pills Pt is not on  home 02  Pt is not on blood thinners  Pt reports constipation BM every few days  No A fib or A flutter Have any cardiac testing pending--no  Pt is ambulatory  Patient's chart reviewed by Cathlyn Parsons CNRA prior to previsit and patient appropriate for the [facility].  Previsit completed and red dot placed by patient's name on their procedure day (on provider's schedule).     PV completed with patient. Prep instructions reviewed and sent via mychart and to mailing address. Rx sent to central [facility] per pts request.

## 2023-04-10 ENCOUNTER — Ambulatory Visit (AMBULATORY_SURGERY_CENTER): Payer: 59 | Admitting: Gastroenterology

## 2023-04-10 ENCOUNTER — Encounter: Payer: Self-pay | Admitting: Gastroenterology

## 2023-04-10 VITALS — BP 120/74 | HR 68 | Temp 99.1°F | Resp 12 | Ht 69.0 in | Wt 176.0 lb

## 2023-04-10 DIAGNOSIS — Z1211 Encounter for screening for malignant neoplasm of colon: Secondary | ICD-10-CM

## 2023-04-10 DIAGNOSIS — D122 Benign neoplasm of ascending colon: Secondary | ICD-10-CM

## 2023-04-10 MED ORDER — SODIUM CHLORIDE 0.9 % IV SOLN
500.0000 mL | Freq: Once | INTRAVENOUS | Status: DC
Start: 2023-04-10 — End: 2023-04-10

## 2023-04-10 NOTE — Progress Notes (Signed)
Pt's states no medical or surgical changes since previsit or office visit. 

## 2023-04-10 NOTE — Op Note (Signed)
[facility] Patient Name: Bobby Liu Procedure Date: 04/10/2023 1:00 PM MRN: [id number] Endoscopist: Meryl Dare , MD, [phone] Age: 57 Referring MD:  Date of Birth: 1966/07/19 Gender: Male Account #: [id number] Procedure:                Colonoscopy Indications:              Screening for colorectal malignant neoplasm Medicines:                Monitored Anesthesia Care Procedure:                Pre-Anesthesia Assessment:                           - Prior to the procedure, a History and Physical                            was performed, and patient medications and                            allergies were reviewed. The patient's tolerance of                            previous anesthesia was also reviewed. The risks                            and benefits of the procedure and the sedation                            options and risks were discussed with the patient.                            All questions were answered, and informed consent                            was obtained. Prior Anticoagulants: The patient has                            taken no anticoagulant or antiplatelet agents. ASA                            Grade Assessment: III - A patient with severe                            systemic disease. After reviewing the risks and                            benefits, the patient was deemed in satisfactory                            condition to undergo the procedure.                           After obtaining informed consent, the colonoscope  was passed under direct vision. Throughout the                            procedure, the patient's blood pressure, pulse, and                            oxygen saturations were monitored continuously. The                            Olympus CF-HQ190L ([id number]) Colonoscope was                            introduced through the anus and advanced to the the                            cecum,  identified by appendiceal orifice and                            ileocecal valve. The ileocecal valve, appendiceal                            orifice, and rectum were photographed. The quality                            of the bowel preparation was adequate after                            extensive lavage, suction. The colonoscopy was                            performed without difficulty. The patient tolerated                            the procedure well. Scope In: 1:31:49 PM Scope Out: 1:47:12 PM Scope Withdrawal Time: 0 hours 11 minutes 37 seconds  Total Procedure Duration: 0 hours 15 minutes 23 seconds  Findings:                 The perianal and digital rectal examinations were                            normal.                           A 10 mm polyp was found in the ascending colon. The                            polyp was sessile. The polyp was removed with a                            cold snare. Resection and retrieval were complete.                           Internal hemorrhoids were found during  retroflexion. The hemorrhoids were small and Grade                            I (internal hemorrhoids that do not prolapse).                           The exam was otherwise without abnormality on                            direct and retroflexion views. Complications:            No immediate complications. Estimated blood loss:                            None. Estimated Blood Loss:     Estimated blood loss: none. Impression:               - One 10 mm polyp in the ascending colon, removed                            with a cold snare. Resected and retrieved.                           - Internal hemorrhoids.                           - The examination was otherwise normal on direct                            and retroflexion views. Recommendation:           - Repeat colonoscopy date to be determined after                            pending pathology results  are reviewed for                            surveillance based on pathology results with a more                            extensive bowel prep.                           - Patient has a contact number available for                            emergencies. The signs and symptoms of potential                            delayed complications were discussed with the                            patient. Return to normal activities tomorrow.                            Written discharge instructions were provided to the  patient.                           - Resume previous diet.                           - Continue present medications.                           - Await pathology results. Meryl Dare, MD 04/10/2023 1:50:56 PM This report has been signed electronically.

## 2023-04-10 NOTE — Progress Notes (Signed)
Report to PACU, RN, vss, BBS= Clear.  

## 2023-04-10 NOTE — Progress Notes (Signed)
Vitals-CW  Pt's states no medical or surgical changes since previsit or office visit. 

## 2023-04-10 NOTE — Patient Instructions (Signed)

## 2023-04-10 NOTE — Progress Notes (Signed)
History & Physical  Primary Care Physician:  Reece Leader, DO Primary Gastroenterologist: Claudette Head, MD  Impression / Plan:  Average risk CRC screening for colonoscopy  CHIEF COMPLAINT:  CRC screening  HPI: Bobby Liu is a 57 y.o. male average risk CRC screening for colonoscopy.    Past Medical History:  Diagnosis Date   Autism    Depression    Hyperlipidemia    OCD (obsessive compulsive disorder)    Seizures (HCC)    Last seizure 2008. 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 Sees Dr Alvester Morin at [facility]).    Past Surgical History:  Procedure Laterality Date   TOOTH EXTRACTION      Prior to Admission medications   Medication Sig Start Date End Date Taking? Authorizing Provider  atorvastatin (LIPITOR) 40 MG tablet Take 1 tablet (40 mg total) by mouth daily. 12/13/22  Yes Ganta, Anupa, DO  FLUoxetine (PROZAC) 20 MG capsule TAKE 1 CAPSULE BY MOUTH EVERY MORNING (BLUE AND GREEN CAPSULE WITH SG 114) 03/12/21  Yes Maness, Philip, MD  lamoTRIgine (LAMICTAL) 200 MG tablet TAKE 1 TABLET BY MOUTH ONCE DAILY (BLUE TRIANGLE SHAPED TABLET WITH UU 114) 12/13/22  Yes Ganta, Anupa, DO    Current Outpatient Medications  Medication Sig Dispense Refill   atorvastatin (LIPITOR) 40 MG tablet Take 1 tablet (40 mg total) by mouth daily. 90 tablet 3   FLUoxetine (PROZAC) 20 MG capsule TAKE 1 CAPSULE BY MOUTH EVERY MORNING (BLUE AND GREEN CAPSULE WITH SG 114) 30 capsule 3   lamoTRIgine (LAMICTAL) 200 MG tablet TAKE 1 TABLET BY MOUTH ONCE DAILY (BLUE TRIANGLE SHAPED TABLET WITH UU 114) 90 tablet 2   Current Facility-Administered Medications  Medication Dose Route Frequency Provider Last Rate Last Admin   0.9 %  sodium chloride infusion  500 mL Intravenous Once Meryl Dare, MD        Allergies as of 04/10/2023 - Review Complete 04/10/2023  Allergen Reaction Noted   Phenobarbital  01/11/2007    Family History  Problem Relation Age of  Onset   Colon cancer Neg Hx    Esophageal cancer Neg Hx    Rectal cancer Neg Hx    Stomach cancer Neg Hx     Social History   Socioeconomic History   Marital status: Single    Spouse name: Not on file   Number of children: 0   Years of education: 12   Highest education level: 12th grade  Occupational History    Employer: [employer]    Comment: dishwasher and drink server  Tobacco Use   Smoking status: Never   Smokeless tobacco: Never  Vaping Use   Vaping Use: Never used  Substance and Sexual Activity   Alcohol use: No   Drug use: No   Sexual activity: Never  Other Topics Concern   Not on file  Social History Narrative   Works as Public affairs consultant and drink server at [employer] Parents divorced.  Sees dad every 3-4 months.  No relationship with mom.  Raised by father/maternal GM.  Grandma passed away 05/23/2009.  One brother in good health.  Sister in good health.      Athlete in [employer] team: plays soccer, basketball).      Lives in [facility] with Leander Rams, wife and family, 2 other clients. No pets. Smoke alarms present. No tripping hazards. Grab bars in restroom.      Reports no  seizures since 1999. Stable on Lamictal. Very high functioning autistic patient with OCD.   Social Determinants of Health   Financial Resource Strain: Low Risk  (01/05/2019)   Overall Financial Resource Strain (CARDIA)    Difficulty of Paying Living Expenses: Not hard at all  Food Insecurity: No Food Insecurity (02/06/2023)   Hunger Vital Sign    Worried About Running Out of Food in the Last Year: Never true    Ran Out of Food in the Last Year: Never true  Transportation Needs: No Transportation Needs (02/06/2023)   PRAPARE - Administrator, Civil Service (Medical): No    Lack of Transportation (Non-Medical): No  Physical Activity: Insufficiently Active (02/06/2023)   Exercise Vital Sign    Days of Exercise per Week: 1 day    Minutes of  Exercise per Session: 30 min  Stress: No Stress Concern Present (02/06/2023)   [employer] of Occupational Health - Occupational Stress Questionnaire    Feeling of Stress : Not at all  Social Connections: Socially Isolated (02/06/2023)   Social Connection and Isolation Panel [NHANES]    Frequency of Communication with Friends and Family: Once a week    Frequency of Social Gatherings with Friends and Family: Three times a week    Attends Religious Services: Never    Active Member of Clubs or Organizations: No    Attends Banker Meetings: Never    Marital Status: Never married  Intimate Partner Violence: Not At Risk (02/06/2023)   Humiliation, Afraid, Rape, and Kick questionnaire    Fear of Current or Ex-Partner: No    Emotionally Abused: No    Physically Abused: No    Sexually Abused: No    Review of Systems:  All systems reviewed were negative except where noted in HPI.   Physical Exam:  General:  Alert, well-developed, in NAD Head:  Normocephalic and atraumatic. Eyes:  Sclera clear, no icterus.   Conjunctiva pink. Ears:  Normal auditory acuity. Mouth:  No deformity or lesions.  Neck:  Supple; no masses. Lungs:  Clear throughout to auscultation.   No wheezes, crackles, or rhonchi.  Heart:  Regular rate and rhythm; no murmurs. Abdomen:  Soft, nondistended, nontender. No masses, hepatomegaly. No palpable masses.  Normal bowel sounds.    Rectal:  Deferred   Msk:  Symmetrical without gross deformities. Extremities:  Without edema. Neurologic:  Alert and  oriented x 4; grossly normal neurologically. Skin:  Intact without significant lesions or rashes. Psych:  Alert and cooperative. Normal mood and affect.   Venita Lick. Russella Dar  04/10/2023, 1:27 PM See Loretha Stapler, [facility], to contact our on call provider

## 2023-04-11 ENCOUNTER — Telehealth: Payer: Self-pay | Admitting: *Deleted

## 2023-04-11 NOTE — Telephone Encounter (Signed)
Attempted to call patient for their post-procedure follow-up call. No answer. Unable to leave voicemail. If you have any questions or concerns following your procedure at [facility], please give us a call.  

## 2023-04-24 ENCOUNTER — Encounter: Payer: Self-pay | Admitting: Gastroenterology

## 2023-06-05 ENCOUNTER — Telehealth: Payer: Self-pay | Admitting: Student

## 2023-06-05 NOTE — Telephone Encounter (Signed)
Patient dropped off form at front desk for [facility] and physical.  Verified that patient section of form has been completed.  Last DOS/WCC with PCP was 01/30/23.  Placed form in blue team folder to be completed by clinical staff.  Vilinda Blanks

## 2023-06-05 NOTE — Telephone Encounter (Signed)
Clinical info completed on FL2 form.  Placed form in PCP's box for completion.    When form is completed, please route note to "RN Team" and place in wall pocket in front office.   Aquilla Solian, CMA

## 2023-06-09 NOTE — Telephone Encounter (Signed)
FL2 form completed and placed in RN Triage box.

## 2023-06-09 NOTE — Telephone Encounter (Signed)
Form faxed to [phone]. ROI on file.   Copy made and placed in batch scanning. Original at front desk.    Veronda Prude, RN

## 2023-09-15 ENCOUNTER — Other Ambulatory Visit: Payer: Self-pay

## 2023-09-15 MED ORDER — LAMOTRIGINE 200 MG PO TABS: ORAL_TABLET | ORAL | 2 refills | Status: AC

## 2023-10-24 ENCOUNTER — Ambulatory Visit (INDEPENDENT_AMBULATORY_CARE_PROVIDER_SITE_OTHER): Payer: 59 | Admitting: Student

## 2023-10-24 ENCOUNTER — Encounter: Payer: Self-pay | Admitting: Student

## 2023-10-24 VITALS — BP 119/69 | HR 84 | Ht 69.0 in | Wt 180.4 lb

## 2023-10-24 DIAGNOSIS — R0683 Snoring: Secondary | ICD-10-CM

## 2023-10-24 NOTE — Progress Notes (Signed)
    SUBJECTIVE:   CHIEF COMPLAINT / HPI:   Snoring Patient presents for concern of snoring, his housemates state that he snores loudly.  He would like to be screened for sleep apnea.  Do you snore loudly: Yes Do you often feel fatigued or tired or sleepy during the day time: No Has anyone observed you stop breathing at night: No Do you have high blood pressure: No BMI (>35 +1): No AGE (>50 +1): Yes Neck circumference (>40 +1): No (36 cm) Gender (Male +1): Yes  OBJECTIVE:   BP 119/69   Pulse 84   Ht 5\' 9"  (1.753 m)   Wt 180 lb 6 oz (81.8 kg)   SpO2 94%   BMI 26.64 kg/m    General: NAD, pleasant Cardio: RRR, no MRG. Cap Refill <2s. Respiratory: CTAB, normal wob on RA Skin: Warm and dry   ASSESSMENT/PLAN:   Assessment & Plan Snoring STOP-BANG score of 3.  Intermediate risk. - Referral for polysomnogram + titration - Follow-up as needed   Tiffany Kocher, DO [facility]

## 2023-10-24 NOTE — Patient Instructions (Signed)
It was great to see you! Thank you for allowing me to participate in your care!   I recommend that you always bring your medications to each appointment as this makes it easy to ensure we are on the correct medications and helps [place] not miss when refills are needed.  Our plans for today:  - I have sent a referral for sleep study - Please follow-up in 1 year for your annual exam, or sooner if needed  Take care and seek immediate care sooner if you develop any concerns. Please remember to show up 15 minutes before your scheduled appointment time!  Tiffany Kocher, DO [facility]

## 2023-11-07 ENCOUNTER — Encounter: Payer: Self-pay | Admitting: Emergency Medicine

## 2023-11-07 ENCOUNTER — Ambulatory Visit
Admission: EM | Admit: 2023-11-07 | Discharge: 2023-11-07 | Disposition: A | Discharge status: Home / Self Care | Payer: 59 | Attending: Internal Medicine | Admitting: Internal Medicine

## 2023-11-07 DIAGNOSIS — L209 Atopic dermatitis, unspecified: Secondary | ICD-10-CM

## 2023-11-07 MED ORDER — TRIAMCINOLONE ACETONIDE 0.1 % EX CREA
1.0000 | TOPICAL_CREAM | Freq: Two times a day (BID) | CUTANEOUS | 0 refills | Status: AC
Start: 2023-11-07 — End: ?

## 2023-11-07 NOTE — ED Triage Notes (Signed)
Patient states that both of his hands "are chapped" x 1 week.  Hands are extremely dry, has redness and rash on them.  Patient has been applying different lotions w/o relief and Vaseline.

## 2023-11-07 NOTE — ED Provider Notes (Signed)
[facility]    CSN: [id number] Arrival date & time: 11/07/23  1055      History   Chief Complaint Chief Complaint  Patient presents with   Rash    HPI Bobby Liu is a 57 y.o. male presents for rash.  Patient reports 1 week of a pruritic red peeling rash on bilateral hands primarily right fourth and third fingers as well as base of the first finger and left 2nd through 4th fingers.  Denies any drainage, swelling, fevers or chills.  Denies history of eczema or rashes in the past.  No new contacts including soaps, medications, detergents, etc.  He has been using over-the-counter lotions without improvement.  No other concerns at this time.   Rash   Past Medical History:  Diagnosis Date   Autism    Depression    Hyperlipidemia    OCD (obsessive compulsive disorder)    Seizures (HCC)    Last seizure 2008. 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 Sees Dr Alvester Morin at [facility]).    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 DISORDER 01/08/2007    Past Surgical History:  Procedure Laterality Date   TOOTH EXTRACTION         Home Medications    Prior to Admission medications   Medication Sig Start Date End Date Taking? Authorizing Provider  atorvastatin (LIPITOR) 40 MG tablet Take 1 tablet (40 mg total) by mouth daily. 12/13/22  Yes Ganta, Anupa, DO  FLUoxetine (PROZAC) 20 MG capsule TAKE 1 CAPSULE BY MOUTH EVERY MORNING (BLUE AND GREEN CAPSULE WITH SG 114) 03/12/21  Yes Maness, Philip, MD  lamoTRIgine (LAMICTAL) 200 MG tablet TAKE 1 TABLET BY MOUTH ONCE DAILY (BLUE TRIANGLE SHAPED TABLET  WITH UU 114) 09/15/23  Yes Tiffany Kocher, DO  triamcinolone cream (KENALOG) 0.1 % Apply 1 Application topically 2 (two) times daily. 11/07/23  Yes Radford Pax, NP    Family History Family History  Problem Relation Age of Onset   Colon cancer Neg Hx    Esophageal cancer Neg Hx    Rectal cancer Neg Hx    Stomach cancer Neg Hx     Social History Social History   Tobacco Use   Smoking status: Never   Smokeless tobacco: Never  Vaping Use   Vaping status: Never Used  Substance Use Topics   Alcohol use: No   Drug use: No     Allergies   Phenobarbital   Review of Systems Review of Systems  Skin:  Positive for rash.     Physical Exam Triage Vital Signs ED Triage Vitals  Encounter Vitals Group     BP 11/07/23 1230 139/79     Systolic BP Percentile --      Diastolic BP Percentile --      Pulse Rate 11/07/23 1230 83     Resp 11/07/23 1230 16     Temp 11/07/23 1230 97.7 F (36.5 C)     Temp Source 11/07/23 1230 Oral     SpO2 11/07/23 1230 95 %     Weight 11/07/23 1232 184 lb (83.5 kg)     Height 11/07/23 1232 5\' 9"  (  1.753 m)     Head Circumference --      Peak Flow --      Pain Score 11/07/23 1232 8     Pain Loc --      Pain Education --      Exclude from Growth Chart --    No data found.  Updated Vital Signs BP 139/79 (BP Location: Left Arm)   Pulse 83   Temp 97.7 F (36.5 C) (Oral)   Resp 16   Ht 5\' 9"  (1.753 m)   Wt 184 lb (83.5 kg)   SpO2 95%   BMI 27.17 kg/m   Visual Acuity Right Eye Distance:   Left Eye Distance:   Bilateral Distance:    Right Eye Near:   Left Eye Near:    Bilateral Near:     Physical Exam Vitals and nursing note reviewed.  Constitutional:      General: He is not in acute distress.    Appearance: Normal appearance. He is not ill-appearing.  HENT:     Head: Normocephalic and atraumatic.  Eyes:     Pupils: Pupils are equal, round, and reactive to light.  Cardiovascular:     Rate and Rhythm: Normal rate.   Pulmonary:     Effort: Pulmonary effort is normal.  Skin:    General: Skin is warm and dry.     Comments: Significant dry/cracked peeling mildly erythematous skin on bilateral hands primarily right first, third and fourth fingers as well as left 2nd through 4th fingers.  No excoriations, swelling, drainage, warmth.  Neurological:     General: No focal deficit present.     Mental Status: He is alert and oriented to person, place, and time.  Psychiatric:        Mood and Affect: Mood normal.        Behavior: Behavior normal.      UC Treatments / Results  Labs (all labs ordered are listed, but only abnormal results are displayed) Labs Reviewed - No data to display  EKG   Radiology No results found.  Procedures Procedures (including critical care time)  Medications Ordered in UC Medications - No data to display  Initial Impression / Assessment and Plan / UC Course  I have reviewed the triage vital signs and the nursing notes.  Pertinent labs & imaging results that were available during my care of the patient were reviewed by me and considered in my medical decision making (see chart for details).     Reviewed exam and sx with patient. No red flags.  Discussed atopic dermatitis.  Trial of triamcinolone topically twice daily advised over-the-counter Aquaphor lotion to be applied generously to hands.  PCP follow-up as symptoms do not improve.  ER precautions reviewed. Final Clinical Impressions(s) / UC Diagnoses   Final diagnoses:  Atopic dermatitis of both hands     Discharge Instructions      Start triamcinolone topical steroid cream twice daily to both hands.  Get over-the-counter Aquaphor lotion and apply often to your hands for hydration.  When you apply this at night put on gloves or mittens to keep it from rubbing off throughout the night.  Please follow-up with your PCP if your symptoms do not improve.  Please go to the ER if you develop any worsening symptoms.  I  hope you feel better soon!    ED Prescriptions     Medication Sig Dispense Auth. Provider   triamcinolone cream (KENALOG) 0.1 % Apply 1 Application topically  2 (two) times daily. 45 g Radford Pax, NP      PDMP not reviewed this encounter.   Radford Pax, NP 11/07/23 [phone]

## 2023-11-07 NOTE — Discharge Instructions (Addendum)
Start triamcinolone topical steroid cream twice daily to both hands.  Get over-the-counter Aquaphor lotion and apply often to your hands for hydration.  When you apply this at night put on gloves or mittens to keep it from rubbing off throughout the night.  Please follow-up with your PCP if your symptoms do not improve.  Please go to the ER if you develop any worsening symptoms.  I hope you feel better soon!

## 2023-12-15 ENCOUNTER — Other Ambulatory Visit: Payer: Self-pay

## 2023-12-15 DIAGNOSIS — E785 Hyperlipidemia, unspecified: Secondary | ICD-10-CM

## 2023-12-16 MED ORDER — ATORVASTATIN CALCIUM 40 MG PO TABS
40.0000 mg | ORAL_TABLET | Freq: Every day | ORAL | 3 refills | Status: AC
Start: 2023-12-16 — End: ?

## 2023-12-20 ENCOUNTER — Encounter (HOSPITAL_COMMUNITY): Payer: Self-pay | Admitting: *Deleted

## 2023-12-20 ENCOUNTER — Emergency Department (HOSPITAL_COMMUNITY)
Admission: EM | Admit: 2023-12-20 | Discharge: 2023-12-21 | Disposition: A | Discharge status: Home / Self Care | Payer: 59 | Attending: Emergency Medicine | Admitting: Emergency Medicine

## 2023-12-20 ENCOUNTER — Other Ambulatory Visit: Payer: Self-pay

## 2023-12-20 DIAGNOSIS — W19XXXA Unspecified fall, initial encounter: Secondary | ICD-10-CM | POA: Diagnosis not present

## 2023-12-20 DIAGNOSIS — F840 Autistic disorder: Secondary | ICD-10-CM | POA: Diagnosis not present

## 2023-12-20 DIAGNOSIS — S0101XA Laceration without foreign body of scalp, initial encounter: Secondary | ICD-10-CM | POA: Insufficient documentation

## 2023-12-20 DIAGNOSIS — S0990XA Unspecified injury of head, initial encounter: Secondary | ICD-10-CM

## 2023-12-20 NOTE — ED Triage Notes (Signed)
 The pt fell in the shower striking his head a few minutes ago  no loss of consciousness  laceration to the back of his head

## 2023-12-21 ENCOUNTER — Emergency Department (HOSPITAL_COMMUNITY): Payer: 59

## 2023-12-21 DIAGNOSIS — S0101XA Laceration without foreign body of scalp, initial encounter: Secondary | ICD-10-CM | POA: Diagnosis not present

## 2023-12-21 MED ORDER — LIDOCAINE-EPINEPHRINE (PF) 2 %-1:200000 IJ SOLN
20.0000 mL | Freq: Once | INTRAMUSCULAR | Status: AC
  Administered 2023-12-21: 20 mL via INTRADERMAL
  Filled 2023-12-21: qty 20

## 2023-12-21 NOTE — ED Notes (Signed)
 Patient transported to CT

## 2023-12-21 NOTE — ED Notes (Signed)
 Patient returned from CT

## 2023-12-21 NOTE — ED Provider Notes (Signed)
 [place] EMERGENCY DEPARTMENT AT [facility] Provider Note  CSN: [id number] Arrival date & time: 12/20/23 2341  Chief Complaint(s) Fall  HPI Bobby Liu is a 58 y.o. male     Fall This is a new problem. The current episode started 1 to 2 hours ago. Episode frequency: once. Pertinent negatives include no chest pain, no abdominal pain, no headaches and no shortness of breath.    Past Medical History Past Medical History:  Diagnosis Date   Autism    Depression    Hyperlipidemia    OCD (obsessive compulsive disorder)    Seizures (HCC)    Last seizure 2008. 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 Sees Dr Carolee at [facility]).   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 DISORDER 01/08/2007   Home Medication(s) Prior to Admission medications   Medication Sig Start Date End Date Taking? Authorizing Provider  atorvastatin  (LIPITOR) 40 MG tablet Take 1 tablet (40 mg total) by mouth daily. 12/16/23   Howell Lunger, DO  FLUoxetine  (PROZAC ) 20 MG capsule TAKE 1 CAPSULE BY MOUTH EVERY MORNING (BLUE AND GREEN CAPSULE WITH SG 114) 03/12/21   Maness, Philip, MD  lamoTRIgine  (LAMICTAL ) 200 MG tablet TAKE 1 TABLET BY MOUTH ONCE DAILY (BLUE TRIANGLE SHAPED TABLET WITH UU 114) 09/15/23   Howell Lunger, DO  triamcinolone  cream (KENALOG ) 0.1 % Apply 1 Application topically 2 (two) times daily. 11/07/23   Loreda Myla SAUNDERS, NP                                                                                                                                    Allergies Phenobarbital  Review of Systems Review of  Systems  Respiratory:  Negative for shortness of breath.   Cardiovascular:  Negative for chest pain.  Gastrointestinal:  Negative for abdominal pain.  Neurological:  Negative for headaches.   As noted in HPI  Physical Exam Vital Signs  I have reviewed the triage vital signs BP (!) 147/90   Pulse 85   Temp 98.4 F (36.9 C)   Resp 18   Ht 5' 9 (1.753 m)   Wt 83.5 kg   SpO2 98%   BMI 27.18 kg/m   Physical Exam Constitutional:      General: He is not in acute distress.    Appearance: He is well-developed. He is not diaphoretic.  HENT:     Head: Normocephalic. Laceration present.      Right Ear: External ear normal.     Left Ear: External ear normal.  Eyes:     General: No scleral  icterus.       Right eye: No discharge.        Left eye: No discharge.     Conjunctiva/sclera: Conjunctivae normal.     Pupils: Pupils are equal, round, and reactive to light.  Cardiovascular:     Rate and Rhythm: Regular rhythm.     Pulses:          Radial pulses are 2+ on the right side and 2+ on the left side.       Dorsalis pedis pulses are 2+ on the right side and 2+ on the left side.     Heart sounds: Normal heart sounds. No murmur heard.    No friction rub. No gallop.  Pulmonary:     Effort: Pulmonary effort is normal. No respiratory distress.     Breath sounds: Normal breath sounds. No stridor.  Abdominal:     General: There is no distension.     Palpations: Abdomen is soft.     Tenderness: There is no abdominal tenderness.  Musculoskeletal:     Cervical back: Normal range of motion and neck supple. No bony tenderness.     Thoracic back: No bony tenderness.     Lumbar back: No bony tenderness.     Comments: Clavicle stable. Chest stable to AP/Lat compression. Pelvis stable to Lat compression. No obvious extremity deformity. No chest or abdominal wall contusion.  Skin:    General: Skin is warm.  Neurological:     Mental Status: He is alert and oriented to person, place, and  time.     GCS: GCS eye subscore is 4. GCS verbal subscore is 5. GCS motor subscore is 6.     Comments: Moving all extremities      ED Results and Treatments Labs (all labs ordered are listed, but only abnormal results are displayed) Labs Reviewed - No data to display                                                                                                                       EKG  EKG Interpretation Date/Time:  Sunday December 21 2023 02:03:02 EST Ventricular Rate:  79 PR Interval:  154 QRS Duration:  84 QT Interval:  430 QTC Calculation: 493 R Axis:   50  Text Interpretation: Sinus rhythm Borderline prolonged QT interval Confirmed by Trine Likes [phone]) on 12/21/2023 2:21:32 AM       Radiology CT Head Wo Contrast Result Date: 12/21/2023 CLINICAL DATA:  Initial evaluation for acute head trauma. EXAM: CT HEAD WITHOUT CONTRAST TECHNIQUE: Contiguous axial images were obtained from the base of the skull through the vertex without intravenous contrast. RADIATION DOSE REDUCTION: This exam was performed according to the departmental dose-optimization program which includes automated exposure control, adjustment of the mA and/or kV according to patient size and/or use of iterative reconstruction technique. COMPARISON:  Prior MRI from 09/27/2016. FINDINGS: Brain: Cerebral volume within normal limits for patient age. No acute intracranial hemorrhage. No acute large vessel territory  infarct. No mass lesion, midline shift, or mass effect. Ventricles are normal in size without hydrocephalus. No extra-axial fluid collection. Vascular: No abnormal hyperdense vessel. Skull: Scalp soft tissues demonstrate no acute abnormality. Calvarium intact. Sinuses/Orbits: Globes and orbital soft tissues within normal limits. Visualized paranasal sinuses are largely clear. No significant mastoid effusion. IMPRESSION: Normal head CT. No acute intracranial abnormality. Electronically Signed   By: Morene Hoard M.D.   On: 12/21/2023 03:18    Medications Ordered in ED Medications  lidocaine -EPINEPHrine  (XYLOCAINE  W/EPI) 2 %-1:200000 (PF) injection 20 mL (20 mLs Intradermal Given by Other 12/21/23 0348)   Procedures .Laceration Repair  Date/Time: 12/21/2023 5:18 AM  Performed by: Trine Raynell Moder, MD Authorized by: Trine Raynell Moder, MD   Consent:    Consent obtained:  Verbal   Consent given by:  Patient   Risks discussed:  Infection, pain, poor cosmetic result and poor wound healing   Alternatives discussed:  No treatment Universal protocol:    Patient identity confirmed:  Verbally with patient Anesthesia:    Anesthesia method:  Local infiltration   Local anesthetic:  Lidocaine  2% WITH epi Laceration details:    Location:  Scalp   Scalp location:  Occipital   Length (cm):  6   Depth (mm):  2 Pre-procedure details:    Preparation:  Patient was prepped and draped in usual sterile fashion and imaging obtained to evaluate for foreign bodies Exploration:    Hemostasis achieved with:  Direct pressure   Imaging outcome: foreign body not noted     Wound extent: fascia not violated, no foreign body and no underlying fracture     Contaminated: no   Treatment:    Area cleansed with:  Saline and povidone-iodine   Amount of cleaning:  Standard   Irrigation solution:  Sterile saline   Irrigation method:  Pressure wash Skin repair:    Repair method:  Staples   Number of staples:  5 Approximation:    Approximation:  Close Repair type:    Repair type:  Simple Post-procedure details:    Dressing:  Non-adherent dressing   Procedure completion:  Tolerated   (including critical care time) Medical Decision Making / ED Course   Medical Decision Making Amount and/or Complexity of Data Reviewed Radiology: ordered.  Risk Prescription drug management.    Fall resulting in scalp laceration. CT negative for fracture or ICH EKG w/o acute ischemic changes, dysrhythmias or  blocks Laceration irrigated and closed as above.    Final Clinical Impression(s) / ED Diagnoses Final diagnoses:  Fall, initial encounter  Injury of head, initial encounter  Laceration of scalp, initial encounter   The patient appears reasonably screened and/or stabilized for discharge and I doubt any other medical condition or other [facility] requiring further screening, evaluation, or treatment in the ED at this time. I have discussed the findings, Dx and Tx plan with the patient/family who expressed understanding and agree(s) with the plan. Discharge instructions discussed at length. The patient/family was given strict return precautions who verbalized understanding of the instructions. No further questions at time of discharge.  Disposition: Discharge  Condition: Good  ED Discharge Orders     None        Follow Up: [facility] Emergency Department at [facility] [address] [place]  [phone]  in 5-7 days, for staple removal    This chart was dictated using voice recognition software.  Despite best efforts to proofread,  errors can occur which can  change the documentation meaning.    Trine Raynell Moder, MD 12/21/23 [phone]

## 2023-12-21 NOTE — Discharge Instructions (Signed)
 Do not let your laceration (cut) get wet for the next 48 hours. After that you may allow soapy water to drain down the wound to clean it.  Please do not scrub.  Do not submerge the wound under water for the next 2 weeks.   To minimize scarring, you can apply a vaseline based ointment for the next 2 weeks and keep it out of direct sun light. After that, you may apply sunscreen for the next several months.   Your staples will need to be removed in 5-7 days   Return if your wound appears to be infected (see laceration care instructions).

## 2023-12-23 ENCOUNTER — Encounter (HOSPITAL_BASED_OUTPATIENT_CLINIC_OR_DEPARTMENT_OTHER): Payer: 59 | Admitting: Internal Medicine

## 2023-12-30 ENCOUNTER — Other Ambulatory Visit: Payer: Self-pay

## 2023-12-30 ENCOUNTER — Emergency Department (HOSPITAL_COMMUNITY)
Admission: EM | Admit: 2023-12-30 | Discharge: 2023-12-30 | Disposition: A | Discharge status: Home / Self Care | Payer: 59 | Attending: Emergency Medicine | Admitting: Emergency Medicine

## 2023-12-30 DIAGNOSIS — Z4802 Encounter for removal of sutures: Secondary | ICD-10-CM | POA: Diagnosis present

## 2023-12-30 NOTE — ED Triage Notes (Signed)
Pt here to have staples removed from back of head. Sutures intact, no swelling or redness noted.

## 2023-12-30 NOTE — Discharge Instructions (Signed)
 Return for any problem.  ?

## 2023-12-30 NOTE — ED Provider Notes (Signed)
[place] EMERGENCY DEPARTMENT AT [facility] Provider Note   CSN: [id number] Arrival date & time: 12/30/23  5366     History  Chief Complaint  Patient presents with   Suture / Staple Removal   Wound Check    Bobby Liu is a 58 y.o. male.  58 year old male with prior medical history as detailed below presents for evaluation and staple removal.  He is without acute complaint.  He requests removal of staples from his scalp wound.  Wound previously treated and cared for here at [facility].    The history is provided by the patient and medical records.       Home Medications Prior to Admission medications   Medication Sig Start Date End Date Taking? Authorizing Provider  atorvastatin (LIPITOR) 40 MG tablet Take 1 tablet (40 mg total) by mouth daily. 12/16/23   Tiffany Kocher, DO  FLUoxetine (PROZAC) 20 MG capsule TAKE 1 CAPSULE BY MOUTH EVERY MORNING (BLUE AND GREEN CAPSULE WITH SG 114) 03/12/21   Maness, Philip, MD  lamoTRIgine (LAMICTAL) 200 MG tablet TAKE 1 TABLET BY MOUTH ONCE DAILY (BLUE TRIANGLE SHAPED TABLET WITH UU 114) 09/15/23   Tiffany Kocher, DO  triamcinolone cream (KENALOG) 0.1 % Apply 1 Application topically 2 (two) times daily. 11/07/23   Radford Pax, NP      Allergies    Phenobarbital    Review of Systems   Review of Systems  All other systems reviewed and are negative.   Physical Exam Updated Vital Signs BP (!) 151/93 (BP Location: Right Arm)   Pulse 71   Temp 98.7 F (37.1 C)   Resp 17   SpO2 98%  Physical Exam Vitals and nursing note reviewed.  Constitutional:      General: He is not in acute distress.    Appearance: He is well-developed.  HENT:     Head: Normocephalic.     Comments: Well-healed wound to posterior scalp.  5 staples in place.  Staples removed without difficulty.  Patient tolerated well. Eyes:     Conjunctiva/sclera: Conjunctivae normal.  Cardiovascular:     Heart sounds: No murmur heard. Pulmonary:      Effort: Pulmonary effort is normal. No respiratory distress.  Musculoskeletal:        General: Normal range of motion.     Cervical back: Neck supple.  Skin:    General: Skin is warm and dry.  Neurological:     Mental Status: He is alert.     ED Results / Procedures / Treatments   Labs (all labs ordered are listed, but only abnormal results are displayed) Labs Reviewed - No data to display  EKG None  Radiology No results found.  Procedures Procedures    Medications Ordered in ED Medications - No data to display  ED Course/ Medical Decision Making/ A&P                                 Medical Decision Making   Medical Screen Complete  This patient presented to the ED with complaint of needs staple removal.  This complaint involves an extensive number of treatment options. The initial differential diagnosis includes, but is not limited to, staple removal  This presentation is: Acute  Patient requires staple removal after previous laceration repair.  He is without other complaint.  5 staples were removed easily from the posterior scalp.  Patient tolerated well.  Patient  instructed on appropriate wound care at home.  Strict return precautions given and understood.    Co morbidities that complicated the patient's evaluation  See HPI   Additional history obtained: External records from outside sources obtained and reviewed including prior ED visits and prior Inpatient records.    Problem List / ED Course:  Staple removal   Reevaluation:  After the interventions noted above, I reevaluated the patient and found that they have: resolved     Disposition:  After consideration of the diagnostic results and the patients response to treatment, I feel that the patent would benefit from close outpatient follow-up.          Final Clinical Impression(s) / ED Diagnoses Final diagnoses:  Removal of staples    Rx / DC Orders ED Discharge Orders      None         Wynetta Fines, MD 12/30/23 [phone]

## 2024-01-09 ENCOUNTER — Ambulatory Visit (HOSPITAL_BASED_OUTPATIENT_CLINIC_OR_DEPARTMENT_OTHER): Payer: 59 | Attending: Family Medicine | Admitting: Internal Medicine

## 2024-01-09 VITALS — Ht 69.0 in | Wt 180.0 lb

## 2024-01-09 DIAGNOSIS — R0683 Snoring: Secondary | ICD-10-CM | POA: Diagnosis present

## 2024-01-10 DIAGNOSIS — R0683 Snoring: Secondary | ICD-10-CM | POA: Insufficient documentation

## 2024-01-15 ENCOUNTER — Ambulatory Visit: Payer: 59

## 2024-01-15 VITALS — Ht 69.0 in | Wt 175.0 lb

## 2024-01-15 DIAGNOSIS — Z0000 Encounter for general adult medical examination without abnormal findings: Secondary | ICD-10-CM

## 2024-01-15 NOTE — Patient Instructions (Addendum)
 Bobby Liu , Thank you for taking time to come for your Medicare Wellness Visit. I appreciate your ongoing commitment to your health goals. Please review the following plan we discussed and let me know if I can assist you in the future.   Referrals/Orders/Follow-Ups/Clinician Recommendations: Yes; Keep maintaining your health by keeping your appointments with Dr. Claudean Severance and any specialists that you may see.  Call us if you need anything.  Have a great year!!!!  This is a list of the screening recommended for you and due dates:  Health Maintenance  Topic Date Due   Flu Shot  06/12/2023   COVID-19 Vaccine (6 - 2024-25 season) 07/13/2023   Medicare Annual Wellness Visit  01/14/2025   Colon Cancer Screening  04/09/2026   DTaP/Tdap/Td vaccine (3 - Td or Tdap) 06/04/2033   Hepatitis C Screening  Completed   HIV Screening  Completed   Zoster (Shingles) Vaccine  Completed   HPV Vaccine  Aged Out    Advanced directives: (Declined) Advance directive discussed with you today. Even though you declined this today, please call our office should you change your mind, and we can give you the proper paperwork for you to fill out.  Next Medicare Annual Wellness Visit scheduled for next year: Yes

## 2024-01-15 NOTE — Progress Notes (Addendum)
 Subjective:   Bobby Liu is a 58 y.o. who presents for a Medicare Wellness preventive visit.  Visit Complete: Virtual I connected with  Ardith Dark on 01/15/24 by a audio enabled telemedicine application and verified that I am speaking with the correct person using two identifiers.  Patient Location: Home  Provider Location: Office/Clinic  I discussed the limitations of evaluation and management by telemedicine. The patient expressed understanding and agreed to proceed.  Vital Signs: Because this visit was a virtual/telehealth visit, some criteria may be missing or patient reported. Any vitals not documented were not able to be obtained and vitals that have been documented are patient reported.  VideoDeclined- This patient declined Librarian, academic. Therefore the visit was completed with audio only.  AWV Questionnaire: No: Patient Medicare AWV questionnaire was not completed prior to this visit.  Cardiac Risk Factors include: advanced age (>43men, >106 women);dyslipidemia;male gender     Objective:    Today's Vitals   01/15/24 1003  Weight: 175 lb (79.4 kg)  Height: 5\' 9"  (1.753 m)  PainSc: 0-No pain   Body mass index is 25.84 kg/m.     01/15/2024   10:05 AM 12/30/2023    8:00 AM 12/20/2023   11:55 PM 10/24/2023    2:45 PM 01/30/2023   11:12 AM 12/25/2021    2:21 PM 09/28/2019    3:46 PM  Advanced Directives  Does Patient Have a Medical Advance Directive? No No No No No No No  Would patient like information on creating a medical advance directive? No - Patient declined   No - Patient declined No - Patient declined No - Patient declined No - Patient declined    Current Medications (verified) Outpatient Encounter Medications as of 01/15/2024  Medication Sig   atorvastatin (LIPITOR) 40 MG tablet Take 1 tablet (40 mg total) by mouth daily.   FLUoxetine (PROZAC) 20 MG capsule TAKE 1 CAPSULE BY MOUTH EVERY MORNING (BLUE AND GREEN  CAPSULE WITH SG 114)   lamoTRIgine (LAMICTAL) 200 MG tablet TAKE 1 TABLET BY MOUTH ONCE DAILY (BLUE TRIANGLE SHAPED TABLET WITH UU 114)   triamcinolone cream (KENALOG) 0.1 % Apply 1 Application topically 2 (two) times daily.   No facility-administered encounter medications on file as of 01/15/2024.    Allergies (verified) Phenobarbital   History: Past Medical History:  Diagnosis Date   Autism    Depression    Hyperlipidemia    OCD (obsessive compulsive disorder)    Seizures (HCC)    Last seizure 2008. 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 Sees Dr Alvester Morin at [facility]   Past Surgical History:  Procedure Laterality Date   TOOTH EXTRACTION     Family History  Problem Relation Age of Onset   Colon cancer Neg Hx    Esophageal cancer Neg Hx    Rectal cancer Neg Hx    Stomach cancer Neg Hx    Social History   Socioeconomic History   Marital status: Single    Spouse name: Not on file   Number of children: 0   Years of education: 13   Highest education level: 12th grade  Occupational History    Employer: [employer]    Comment: dishwasher and drink server  Tobacco Use   Smoking status: Never   Smokeless tobacco: Never  Vaping Use   Vaping status: Never Used  Substance and Sexual Activity   Alcohol use: No   Drug use:  No   Sexual activity: Never  Other Topics Concern   Not on file  Social History Narrative   Works as Public affairs consultant and drink server at [employer] Parents divorced.  Sees dad every 3-4 months.  No relationship with mom.  Raised by father/maternal GM.  Grandma passed away 06-03-2009.  One brother in good health.  Sister in good health.      Athlete in [employer] team: plays soccer, basketball).      Lives in [facility] with Leander Rams, wife and family, 2 other clients. No pets. Smoke alarms present. No tripping hazards. Grab bars in restroom.      Reports no seizures since 1999.  Stable on Lamictal. Very high functioning autistic patient with OCD.   Social Drivers of Corporate investment banker Strain: Low Risk  (01/15/2024)   Overall Financial Resource Strain (CARDIA)    Difficulty of Paying Living Expenses: Not hard at all  Food Insecurity: No Food Insecurity (01/15/2024)   Hunger Vital Sign    Worried About Running Out of Food in the Last Year: Never true    Ran Out of Food in the Last Year: Never true  Transportation Needs: No Transportation Needs (01/15/2024)   PRAPARE - Administrator, Civil Service (Medical): No    Lack of Transportation (Non-Medical): No  Physical Activity: Sufficiently Active (01/15/2024)   Exercise Vital Sign    Days of Exercise per Week: 3 days    Minutes of Exercise per Session: 150+ min  Stress: No Stress Concern Present (01/15/2024)   [employer] of Occupational Health - Occupational Stress Questionnaire    Feeling of Stress : Not at all  Social Connections: Socially Isolated (01/15/2024)   Social Connection and Isolation Panel [NHANES]    Frequency of Communication with Friends and Family: Once a week    Frequency of Social Gatherings with Friends and Family: Three times a week    Attends Religious Services: Never    Active Member of Clubs or Organizations: No    Attends Banker Meetings: Never    Marital Status: Never married    Tobacco Counseling Counseling given: Not Answered    Clinical Intake:  Pre-visit preparation completed: Yes  Pain : No/denies pain Pain Score: 0-No pain     BMI - recorded: 25.84 Nutritional Status: BMI 25 -29 Overweight Nutritional Risks: None Diabetes: No  How often do you need to have someone help you when you read instructions, pamphlets, or other written materials from your doctor or pharmacy?: 1 - Never What is the last grade level you completed in school?: HSG  Interpreter Needed?: No  Information entered by :: Jacinta Penalver N. Ellamay Fors, LPN.   Activities of  Daily Living     01/15/2024   10:07 AM 02/06/2023   11:06 AM  In your present state of health, do you have any difficulty performing the following activities:  Hearing? 0 0  Vision? 0 1  Comment  Sept/Oct 2023 [facility]  Difficulty concentrating or making decisions? 0 0  Walking or climbing stairs? 0 0  Dressing or bathing? 0 0  Doing errands, shopping? 0 0  Preparing Food and eating ? N   Using the Toilet? N   In the past six months, have you accidently leaked urine? N   Do you have problems with loss of bowel control? N   Managing your Medications? N   Managing your Finances? N   Housekeeping  or managing your Housekeeping? N     Patient Care Team: Tiffany Kocher, DO as PCP - General (Family Medicine) Van Clines, MD as Consulting Physician (Neurology) [facility]) Edward Jolly and Edward Jolly (Dentistry) Conley Rolls, My Kelly, [place] as Referring Physician (Optometry)  Indicate any recent Medical Services you may have received from other than [facility] providers in the past year (date may be approximate).     Assessment:   This is a routine wellness examination for [place].  Hearing/Vision screen Hearing Screening - Comments:: Denies hearing difficulties.  Vision Screening - Comments:: Wears rx glasses - up to date with routine eye exams with [facility] with Dr. My Mardene Sayer    Goals Addressed             This Visit's Progress    Maintain my health by staying independent and physically active.         Depression Screen     01/15/2024   10:06 AM 10/24/2023    2:45 PM 02/06/2023   11:05 AM 01/30/2023   11:13 AM 12/25/2021    2:21 PM 12/07/2020    4:11 PM 09/27/2020   11:48 AM  PHQ 2/9 Scores  PHQ - 2 Score 0 0 0 0 0 0 0  PHQ- 9 Score 0 0  0 0 0 0    Fall Risk     01/15/2024   10:05 AM 02/06/2023   11:06 AM 09/27/2020   11:48 AM 09/28/2019    3:45 PM 04/14/2019    3:01 PM  Fall Risk   Falls in the past year? 1 0 0 0 0  Number falls in past yr: 0 0 0 0 0   Injury with Fall? 1 0 0    Risk for fall due to :  No Fall Risks     Follow up Falls prevention discussed;Falls evaluation completed Falls evaluation completed       MEDICARE RISK AT HOME:  Medicare Risk at Home Any stairs in or around the home?: Yes If so, are there any without handrails?: No Home free of loose throw rugs in walkways, pet beds, electrical cords, etc?: Yes Adequate lighting in your home to reduce risk of falls?: Yes Life alert?: No Use of a cane, walker or w/c?: No Grab bars in the bathroom?: No Shower chair or bench in shower?: No Elevated toilet seat or a handicapped toilet?: No  TIMED UP AND GO:  Was the test performed?  No  Cognitive Function: 6CIT completed    01/15/2024   10:07 AM 01/05/2019    9:10 AM  MMSE - Mini Mental State Exam  Not completed: Unable to complete   Orientation to time  5  Orientation to Place  5  Registration  3  Attention/ Calculation  5  Recall  3  Language- name 2 objects  2  Language- repeat  1  Language- follow 3 step command  3  Language- read & follow direction  1  Write a sentence  1  Copy design  1  Total score  30        01/15/2024   10:07 AM 02/06/2023   11:07 AM 01/05/2019    9:10 AM  6CIT Screen  What Year? 0 points 0 points 0 points  What month? 0 points 0 points 0 points  What time? 0 points 0 points 0 points  Count back from 20 0 points 0 points 0 points  Months in reverse 0 points 0 points 0  points  Repeat phrase 0 points 0 points   Total Score 0 points 0 points     Immunizations Immunization History  Administered Date(s) Administered   Influenza Whole 08/21/2009   Influenza,inj,Quad PF,6+ Mos 07/19/2013, 10/23/2017, 12/08/2018, 12/25/2021, 01/30/2023   Influenza-Unspecified 08/11/2019   Moderna SARS-COV2 Booster Vaccination 08/30/2021   Moderna Sars-Covid-2 Vaccination 01/09/2020, 02/12/2020, 10/25/2020   Pfizer(Comirnaty)Fall Seasonal Vaccine 12 years and older 01/30/2023   Td 10/05/2007    Tdap 06/05/2023   Zoster Recombinant(Shingrix) 03/06/2023, 06/03/2023    Screening Tests Health Maintenance  Topic Date Due   INFLUENZA VACCINE  06/12/2023   COVID-19 Vaccine (6 - 2024-25 season) 07/13/2023   Medicare Annual Wellness (AWV)  01/14/2025   Colonoscopy  04/09/2026   DTaP/Tdap/Td (3 - Td or Tdap) 06/04/2033   Hepatitis C Screening  Completed   HIV Screening  Completed   Zoster Vaccines- Shingrix  Completed   HPV VACCINES  Aged Out    Health Maintenance  Health Maintenance Due  Topic Date Due   INFLUENZA VACCINE  06/12/2023   COVID-19 Vaccine (6 - 2024-25 season) 07/13/2023   Health Maintenance Items Addressed: Yes; Patient is due for Covid-19 and Flu vaccine.  Additional Screening:  Vision Screening: Recommended annual ophthalmology exams for early detection of glaucoma and other disorders of the eye.  Dental Screening: Recommended annual dental exams for proper oral hygiene  Community Resource Referral / Chronic Care Management: CRR required this visit?  No   CCM required this visit?  No     Plan:     I have personally reviewed and noted the following in the patient's chart:   Medical and social history Use of alcohol, tobacco or illicit drugs  Current medications and supplements including opioid prescriptions. Patient is not currently taking opioid prescriptions. Functional ability and status Nutritional status Physical activity Advanced directives List of other physicians Hospitalizations, surgeries, and ER visits in previous 12 months Vitals Screenings to include cognitive, depression, and falls Referrals and appointments  In addition, I have reviewed and discussed with patient certain preventive protocols, quality metrics, and best practice recommendations. A written personalized care plan for preventive services as well as general preventive health recommendations were provided to patient.     Mickeal Needy, LPN   01/11/4400    After Visit Summary: (MyChart) Due to this being a telephonic visit, the after visit summary with patients personalized plan was offered to patient via MyChart   Notes: Please see routine comments.

## 2024-01-17 DIAGNOSIS — R0683 Snoring: Secondary | ICD-10-CM

## 2024-01-17 NOTE — Procedures (Signed)
    [facility] [address] [place], [place] [address] Tel: [phone]   Fax: [phone]  Polysomnography Interpretation  Patient Name:  Bobby Liu, Bobby Liu Date:  01/09/2024 Referring Physician:  Larence Penning  MD  Indications for Polysomnography The patient is a 58 year-old Male who is 5\' 9"  and weighs 180.0 lbs. His BMI equals 26.7.  A full night polysomnogram was performed to evaluate for -.Snoring  Polysomnogram Data A full night polysomnogram recorded the standard physiologic parameters including EEG, EOG, EMG, EKG, nasal and oral airflow.  Respiratory parameters of chest and abdominal movements were recorded with Respiratory Inductance Plethysmography belts.  Oxygen saturation was recorded by pulse oximetry.   Sleep Architecture The total recording time of the polysomnogram was 435.4 minutes.  The total sleep time was 324.5 minutes.  The patient spent 1.8% of total sleep time in Stage N1, 63.2% in Stage N2, 0.0% in Stages N3, and 35.0% in REM.  Sleep latency was 65.3 minutes.  REM latency was 78.0 minutes.  Sleep Efficiency was 74.5%.  Wake after Sleep Onset time was 46.0 minutes.  Respiratory Events The polysomnogram revealed a presence of 3 obstructive, - central, and - mixed apneas resulting in an Apnea index of 0.6 events per hour.  There were 23 hypopneas (>=3% desaturation and/or arousal) resulting in an Apnea\Hypopnea Index (AHI >=3% desaturation and/or arousal) of 4.8 events per hour.  There were 16 hypopneas (>=4% desaturation) resulting in an Apnea\Hypopnea Index (AHI >=4% desaturation) of 3.5 events per hour.  There were - Respiratory Effort Related Arousals resulting in a RERA index of - events per hour. The Respiratory Disturbance Index is 4.8 events per hour.  The snore index was 143.7 events per hour.  Mean oxygen saturation was 93.9%.  The lowest oxygen saturation during sleep was 84.0%.  Time spent <=88% oxygen  saturation was 1.7 minutes (0.4%).  End Tidal CO2 during sleep ranged from - to - mmHg. End Tidal CO2 was greater than 50 mmHg for - minutes and greater than 55 mmHg for - minutes.  Limb Activity There were 0 total limb movements recorded, of this total, - were classified as PLMs.  PLM index was - per hour and PLM associated with Arousals index was - per hour.  Cardiac Summary The average pulse rate was 65.4 bpm.  The minimum pulse rate was 56.0 bpm while the maximum pulse rate was 103.0 bpm.  Cardiac rhythm was normal..  Comment: OccasionaLapneas and hypopneas, within normal limits, AHI(3%) 4.8/hr. Episodic snoring with oxygen desaturation to a nadir of 84%, mean 93.9%.  Diagnosis: Primary snoring  Recommendations: Manage for snoring and symptoms based on clinical judgment.   This study was personally reviewed and electronically signed by: Jetty Duhamel MD Accredited Board Certified in Sleep Medicine Date/Time:                                Jetty Duhamel Diplomate, American Board of Sleep Medicine  ELECTRONICALLY SIGNED ON:  01/17/2024, 11:47 AM [facility] PH: [phone]   FX: [phone] ACCREDITED BY THE AMERICAN ACADEMY OF SLEEP MEDICINE

## 2024-02-01 NOTE — Progress Notes (Unsigned)
 [facility]   [phone]) [phone] [phone]   Clinic New consult Note   Patient Care Team: Tim Lair as PCP - General (Physician Assistant) Van Clines, MD as Consulting Physician (Neurology) [place] ([facility]) Edward Jolly and [place] (Dentistry) Conley Rolls, My [place], [place] as Referring Physician (Optometry) 02/03/2024  CHIEF COMPLAINTS/PURPOSE OF CONSULTATION:  thrombocytosis  HISTORY OF PRESENTING ILLNESS:  Bobby Liu 58 y.o. male is here because of thrombocytosis He has medical history of auticstic spectrum disorder, OCD, and seizure disorder. He was found to have significantly elevated platelet count of 851 during a recent routine physical. The remainder of his CBC was within normal limits. He did have iron panel performed. Iron was 84; TIBC 333; iron saturation 85%; ferritin 114. His CMP was unremarkable. Reviewing labs, he did have normal CBC 9 years ago. Six years ago, platelet count elevated to 26. Three years ago, platelet count was 586. Hepatits C screening was completed 20 February 26, 2021 and was negative. In 02/27/2019, he was negative for HIV. He had screening colonoscopy in 02/27/23 which yielded a single, benign tubular adenoma.  The patient has no symptoms on physical concerns today. He denies chest pain, chest pressure, or shortness of breath. He denies headaches or visual disturbances. He denies abdominal pain, nausea, vomiting, or changes in bowel or bladder habits.  He denies pain in legs or weakness in any extremities. He states that he has been on Lamictal for more than 20 years and has been seizure-free since he started taking this medication. He states that he does have a new neurology consult coming up in 03/2024.  Socially, [name] is single and does not have children. He currently lives in assisted family living home. He states that his mother passed away between Feb 26, 2014 and 02-26-17. His father and sister both live in [place]. He has a brother  who lives in [place]. The patient is unaware of any cancer or blood disorders in his family. He does not smoke. He does not drink alcohol. He does not take any drugs or medications other than the ones he is prescribed.   He was found to have abnormal CBC from 01/15/2024.  He denies recent chest pain on exertion, shortness of breath on minimal exertion, pre-syncopal episodes, or palpitations. He had not noticed any recent bleeding such as epistaxis, hematuria or hematochezia The patient denies over the counter NSAID ingestion. He is not on antiplatelets agents. He does take a baby aspirin every day.  His last colonoscopy was 02/27/23. He had single, benign, tubular adenoma.  He had no prior history or diagnosis of cancer. His age appropriate screening programs are up-to-date. He denies any pica and eats a variety of diet.   REVIEW OF SYSTEMS:   Constitutional: Denies fevers, chills or abnormal night sweats Eyes: Denies blurriness of vision, double vision or watery eyes Ears, nose, mouth, throat, and face: Denies mucositis or sore throat Respiratory: Denies cough, dyspnea or wheezes Cardiovascular: Denies palpitation, chest discomfort or lower extremity swelling Gastrointestinal:  Denies nausea, heartburn or change in bowel habits Skin: Denies abnormal skin rashes Lymphatics: Denies new lymphadenopathy or easy bruising Neurological:Denies numbness, tingling or new weaknesses Behavioral/Psych: Mood is stable, no new changes   All other systems were reviewed with the patient and are negative.   MEDICAL HISTORY:  Past Medical History:  Diagnosis Date   Autism    Depression    Hyperlipidemia    OCD (obsessive compulsive disorder)    Seizures (HCC)  Last seizure 2008. 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 Sees Dr Alvester Morin at [facility]    SURGICAL HISTORY: Past Surgical History:  Procedure Laterality Date   TOOTH EXTRACTION      SOCIAL  HISTORY: Social History   Socioeconomic History   Marital status: Single    Spouse name: Not on file   Number of children: 0   Years of education: 12   Highest education level: 12th grade  Occupational History    Employer: [employer]    CommentBuyer, retail and drink server  Tobacco Use   Smoking status: Never   Smokeless tobacco: Never  Vaping Use   Vaping status: Never Used  Substance and Sexual Activity   Alcohol use: No   Drug use: No   Sexual activity: Never  Other Topics Concern   Not on file  Social History Narrative   Works as Public affairs consultant and drink server at [employer] Parents divorced.  Sees dad every 3-4 months.  No relationship with mom.  Raised by father/maternal GM.  Grandma passed away Jun 25, 2009.  One brother in good health.  Sister in good health.      Athlete in [employer] team: plays soccer, basketball).      Lives in [facility] group [facility] with Leander Rams, wife and family, 2 other clients. No pets. Smoke alarms present. No tripping hazards. Grab bars in restroom.      Reports no seizures since 1999. Stable on Lamictal. Very high functioning autistic patient with OCD.   Social Drivers of Corporate investment banker Strain: Low Risk  (01/15/2024)   Overall Financial Resource Strain (CARDIA)    Difficulty of Paying Living Expenses: Not hard at all  Food Insecurity: No Food Insecurity (01/15/2024)   Hunger Vital Sign    Worried About Running Out of Food in the Last Year: Never true    Ran Out of Food in the Last Year: Never true  Transportation Needs: No Transportation Needs (01/15/2024)   PRAPARE - Administrator, Civil Service (Medical): No    Lack of Transportation (Non-Medical): No  Physical Activity: Sufficiently Active (01/15/2024)   Exercise Vital Sign    Days of Exercise per Week: 3 days    Minutes of Exercise per Session: 150+ min  Stress: No Stress Concern Present (01/15/2024)   [employer] of Occupational  Health - Occupational Stress Questionnaire    Feeling of Stress : Not at all  Social Connections: Socially Isolated (01/15/2024)   Social Connection and Isolation Panel [NHANES]    Frequency of Communication with Friends and Family: Once a week    Frequency of Social Gatherings with Friends and Family: Three times a week    Attends Religious Services: Never    Active Member of Clubs or Organizations: No    Attends Banker Meetings: Never    Marital Status: Never married  Intimate Partner Violence: Not At Risk (01/15/2024)   Humiliation, Afraid, Rape, and Kick questionnaire    Fear of Current or Ex-Partner: No    Emotionally Abused: No    Physically Abused: No    Sexually Abused: No    FAMILY HISTORY: Family History  Problem Relation Age of Onset   Colon cancer Neg Hx    Esophageal cancer Neg Hx    Rectal cancer Neg Hx    Stomach cancer Neg Hx     ALLERGIES:  is allergic to phenobarbital.  MEDICATIONS:  Current Outpatient Medications  Medication Sig Dispense Refill   atorvastatin (LIPITOR) 40 MG tablet Take 1 tablet (40 mg total) by mouth daily. 90 tablet 3   FLUoxetine (PROZAC) 20 MG capsule TAKE 1 CAPSULE BY MOUTH EVERY MORNING (BLUE AND GREEN CAPSULE WITH SG 114) 30 capsule 3   lamoTRIgine (LAMICTAL) 200 MG tablet TAKE 1 TABLET BY MOUTH ONCE DAILY (BLUE TRIANGLE SHAPED TABLET WITH UU 114) 90 tablet 2   triamcinolone cream (KENALOG) 0.1 % Apply 1 Application topically 2 (two) times daily. 45 g 0   No current facility-administered medications for this visit.    PHYSICAL EXAMINATION: ECOG PERFORMANCE STATUS: 0 - Asymptomatic  Vitals:   02/03/24 1116  BP: 114/78  Pulse: 90  Resp: 20  Temp: 98.4 F (36.9 C)  SpO2: 98%   Filed Weights   02/03/24 1116  Weight: 175 lb 4.8 oz (79.5 kg)    GENERAL:alert, no distress and comfortable SKIN: skin color, texture, turgor are normal, no rashes or significant lesions EYES: normal, conjunctiva are pink and  non-injected, sclera clear OROPHARYNX:no exudate, no erythema and lips, buccal mucosa, and tongue normal  NECK: supple, thyroid normal size, non-tender, without nodularity LYMPH:  no palpable lymphadenopathy in the cervical, axillary or inguinal LUNGS: clear to auscultation and percussion with normal breathing effort HEART: regular rate & rhythm and no murmurs and no lower extremity edema ABDOMEN:abdomen soft, non-tender and normal bowel sounds Musculoskeletal:no cyanosis of digits and no clubbing  PSYCH: alert & oriented x 3 with fluent speech NEURO: no focal motor/sensory deficits  LABORATORY DATA:  I have reviewed the data as listed    Latest Ref Rng & Units 12/08/2020   10:38 AM 02/03/2018    4:17 PM 07/12/2014    4:20 PM  CBC  WBC 3.4 - 10.8 x10E3/uL 7.7  7.8  8.8   Hemoglobin 13.0 - 17.7 g/dL 16.1  09.6  04.5   Hematocrit 37.5 - 51.0 % 45.0  40.3  44.6   Platelets 150 - 450 x10E3/uL 586  426  382        Latest Ref Rng & Units 12/08/2020   10:38 AM 09/28/2019    5:10 PM 02/03/2018    4:17 PM  CMP  Glucose 65 - 99 mg/dL 89  86  89   BUN 6 - 24 mg/dL 12  13  15    Creatinine 0.76 - 1.27 mg/dL 4.09  8.11  9.14   Sodium 134 - 144 mmol/L 137  142  142   Potassium 3.5 - 5.2 mmol/L 4.6  4.0  5.0   Chloride 96 - 106 mmol/L 101  101  102   CO2 20 - 29 mmol/L 24  25  23    Calcium 8.7 - 10.2 mg/dL 9.6  9.7  9.6   Total Protein 6.0 - 8.5 g/dL 7.2     Total Bilirubin 0.0 - 1.2 mg/dL 0.3     Alkaline Phos 44 - 121 IU/L 80     AST 0 - 40 IU/L 13     ALT 0 - 44 IU/L 16        RADIOGRAPHIC STUDIES: Sleep Study Documents Result Date: 01/23/2024 Ordered by an unspecified provider.   Orders Placed This Encounter  Procedures   JAK2 V617F rfx CALR/MPL/E12-15    Standing Status:   Future    Number of Occurrences:   1    Expected Date:   02/03/2024    Expiration Date:   02/02/2025   Assessment and plan  Thrombocytosis Assessment & Plan: He was found to have significantly elevated  platelet count of 851 during a recent routine physical. The remainder of his CBC was within normal limits. He did have iron panel performed. Iron was 84; TIBC 333; iron saturation 85%; ferritin 114. His CMP was unremarkable. Reviewing labs, he did have normal CBC 9 years ago. Six years ago, platelet count elevated to 26. Three years ago, platelet count was 586. Hepatits C screening was completed 20 2022 and was negative. In 2020, he was negative for HIV. He had screening colonoscopy in 2024 which yielded a single, benign tubular adenoma.  -02/03/2024 - check JAK2/MPN to evaluate for essential thrombocythemia. Continue with aspirin 81 mg daily and lipitor 40 mg daily.  --plan to see him back yearly for lab check and follow up. He can also be seen sooner if needed.   Orders: -     JAK2 V617F rfx CALR/MPL/E12-15; Future   The patient was seen along with Dr. Mosetta Putt today. All questions were answered. The patient knows to call the clinic with any problems, questions or concerns. The total time spent in the appointment was 30 minutes.     Carlean Jews, NP 02/03/2024 1:02 PM  Addendum I have seen the patient, examined him. I agree with the assessment and and plan and have edited the notes.   58 year old gentleman with past medical history of autism and seizure disorder, referred by PCP for thrombocytosis.  He has not had mildly elevated platelet count for the past 6 years, the rest of CBC has been normal.  No history of thrombosis, stroke, or heart attack.  Recent iron study has been normal, no clinical suspicion for malignancy.  I suspect that this is essential thrombocythemia, will obtain JAK2 and MPN panel for evaluation.  Given his young age, he does not need treatment before age of 60.  He is on aspirin 1 mg daily, which I encouraged him to continue.  We will call him with lab results, and see him back in a few years.  All questions were answered.  I spent a total of 30 minutes for his visit today,  more than 50% time on face-to-face counseling.  Malachy Mood MD 02/03/2024

## 2024-02-03 ENCOUNTER — Other Ambulatory Visit: Payer: Self-pay

## 2024-02-03 ENCOUNTER — Inpatient Hospital Stay: Attending: Nurse Practitioner | Admitting: Nurse Practitioner

## 2024-02-03 ENCOUNTER — Inpatient Hospital Stay

## 2024-02-03 VITALS — BP 114/78 | HR 90 | Temp 98.4°F | Resp 20 | Ht 69.0 in | Wt 175.3 lb

## 2024-02-03 DIAGNOSIS — D75839 Thrombocytosis, unspecified: Secondary | ICD-10-CM | POA: Diagnosis present

## 2024-02-03 DIAGNOSIS — Z7982 Long term (current) use of aspirin: Secondary | ICD-10-CM | POA: Insufficient documentation

## 2024-02-03 DIAGNOSIS — Z79899 Other long term (current) drug therapy: Secondary | ICD-10-CM | POA: Insufficient documentation

## 2024-02-03 NOTE — Assessment & Plan Note (Signed)
 He was found to have significantly elevated platelet count of 851 during a recent routine physical. The remainder of his CBC was within normal limits. He did have iron panel performed. Iron was 84; TIBC 333; iron saturation 85%; ferritin 114. His CMP was unremarkable. Reviewing labs, he did have normal CBC 9 years ago. Six years ago, platelet count elevated to 26. Three years ago, platelet count was 586. Hepatits C screening was completed 20 2022 and was negative. In 2020, he was negative for HIV. He had screening colonoscopy in 2024 which yielded a single, benign tubular adenoma.  -02/03/2024 - check JAK2/MPN to evaluate for essential thrombocythemia. Continue with aspirin 81 mg daily and lipitor 40 mg daily.  --plan to see him back yearly for lab check and follow up. He can also be seen sooner if needed.

## 2024-02-04 ENCOUNTER — Other Ambulatory Visit: Payer: Self-pay

## 2024-02-04 NOTE — Progress Notes (Signed)
 Per Heather Boscia,NP. Faxed 02/03/24 Visit Notes to patient's PCP Altamese Dilling, PA  [facility] F# [phone] T# [phone] Confirmation received.

## 2024-02-09 LAB — JAK2 V617F RFX CALR/MPL/E12-15

## 2024-02-09 LAB — CALR +MPL + E12-E15  (REFLEX)

## 2024-03-25 ENCOUNTER — Ambulatory Visit (INDEPENDENT_AMBULATORY_CARE_PROVIDER_SITE_OTHER): Admitting: Neurology

## 2024-03-25 ENCOUNTER — Encounter: Payer: Self-pay | Admitting: Neurology

## 2024-03-25 VITALS — BP 121/78 | HR 80 | Ht 69.0 in | Wt 173.5 lb

## 2024-03-25 DIAGNOSIS — G40909 Epilepsy, unspecified, not intractable, without status epilepticus: Secondary | ICD-10-CM | POA: Diagnosis not present

## 2024-03-25 DIAGNOSIS — Z5181 Encounter for therapeutic drug level monitoring: Secondary | ICD-10-CM

## 2024-03-25 NOTE — Patient Instructions (Signed)
 Continue lamotrigine  200 mg daily Will check lamotrigine  level with BMP Will check updated EEG, I will contact you to go over the result Follow-up in 1 year or sooner if worse.

## 2024-03-25 NOTE — Progress Notes (Signed)
 [facility]  PATIENT: Bobby Liu DOB: [birth year]  REQUESTING CLINICIAN: Rexene Catching, PA-C HISTORY FROM: Patient  REASON FOR VISIT: History of seizures, new episode of syncope    HISTORICAL  CHIEF COMPLAINT:  Chief Complaint  Patient presents with   New Patient (Initial Visit)    Pt in room 12. Ron in room. Paper referral for epilepsy. Pt reports he has episode of blacking out, last episode with in Feb. He fall in the bathroom.    HISTORY OF PRESENT ILLNESS:  This is a 58 year old gentleman with past medical history of autism spectrum disorder, seizure disorder described as generalized epilepsy, OCD who is presenting after blackout episodes .   In term of his seizures.  Patient developed seizure in 1990, described as generalized seizure.  He did try phenobarbital, Tegretol, and gabapentin and in 1998, started on lamotrigine .  Since being on lamotrigine  he has been seizure-free, his last seizure was on 1999.  He is doing extremely well on lamotrigine  200 mg daily.  Unfortunately on February 9, he had a syncopal episode.  He remembers getting up to use the bathroom and next thing he is on the floor with a head laceration; denies biting his tongue or urinary incontinence.  Staff member heard a loud noise and went to the bathroom, at that time patient was awake and oriented.  He was taken to the hospital, head CT negative.  They also report another syncopal episode last year, again very brief with no associated confusion or abnormal movement.    Handedness: Right handed   Onset: 1980, last one August 08 1998  Seizure Type: Generalized convulsion   Current frequency: Last seizure in 1999  Any injuries from seizures: Denies  Seizure risk factors: ASD  Previous ASMs: Phenobarbital, Tegretol, Gabapentin   Currenty ASMs: Lamotrigine  200 mg AM   ASMs side effects: Denies   Brain Images: Normal head CT  Previous EEGs: Normal    OTHER  MEDICAL CONDITIONS: ASD, OCD   REVIEW OF SYSTEMS: Full 14 system review of systems performed and negative with exception of: As noted in the HPI   ALLERGIES: Allergies  Allergen Reactions   Phenobarbital     REACTION: unspecified    HOME MEDICATIONS: Outpatient Medications Prior to Visit  Medication Sig Dispense Refill   atorvastatin  (LIPITOR) 40 MG tablet Take 1 tablet (40 mg total) by mouth daily. 90 tablet 3   FLUoxetine  (PROZAC ) 20 MG capsule TAKE 1 CAPSULE BY MOUTH EVERY MORNING (BLUE AND GREEN CAPSULE WITH SG 114) 30 capsule 3   lamoTRIgine  (LAMICTAL ) 200 MG tablet TAKE 1 TABLET BY MOUTH ONCE DAILY (BLUE TRIANGLE SHAPED TABLET WITH UU 114) 90 tablet 2   triamcinolone  cream (KENALOG ) 0.1 % Apply 1 Application topically 2 (two) times daily. 45 g 0   No facility-administered medications prior to visit.    PAST MEDICAL HISTORY: Past Medical History:  Diagnosis Date   Autism    Depression    Hyperlipidemia    OCD (obsessive compulsive disorder)    Seizures (HCC)    Last seizure 2008. 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 Sees Dr Parke Boll at [facility]).    PAST SURGICAL HISTORY: Past Surgical History:  Procedure Laterality Date   TOOTH EXTRACTION      FAMILY HISTORY: Family History  Problem Relation Age of Onset   Colon cancer Neg Hx    Esophageal cancer Neg Hx    Rectal cancer Neg Hx  Stomach cancer Neg Hx     SOCIAL HISTORY: Social History   Socioeconomic History   Marital status: Single    Spouse name: Not on file   Number of children: 0   Years of education: 12   Highest education level: 12th grade  Occupational History    Employer: [employer]    Comment: dishwasher and drink server  Tobacco Use   Smoking status: Never   Smokeless tobacco: Never  Vaping Use   Vaping status: Never Used  Substance and Sexual Activity   Alcohol use: No   Drug use: No   Sexual activity: Never  Other Topics  Concern   Not on file  Social History Narrative   Works as Public affairs consultant and drink server at [employer] Parents divorced.  Sees dad every 3-4 months.  No relationship with mom.  Raised by father/maternal GM.  Grandma passed away 06/18/09.  One brother in good health.  Sister in good health.      Athlete in [employer] team: plays soccer, basketball).      Lives in [facility] with Jonel Nephew, wife and family, 2 other clients. No pets. Smoke alarms present. No tripping hazards. Grab bars in restroom.      Reports no seizures since 1999. Stable on Lamictal . Very high functioning autistic patient with OCD.   Social Drivers of Corporate investment banker Strain: Low Risk  (01/15/2024)   Overall Financial Resource Strain (CARDIA)    Difficulty of Paying Living Expenses: Not hard at all  Food Insecurity: No Food Insecurity (01/15/2024)   Hunger Vital Sign    Worried About Running Out of Food in the Last Year: Never true    Ran Out of Food in the Last Year: Never true  Transportation Needs: No Transportation Needs (01/15/2024)   PRAPARE - Administrator, Civil Service (Medical): No    Lack of Transportation (Non-Medical): No  Physical Activity: Sufficiently Active (01/15/2024)   Exercise Vital Sign    Days of Exercise per Week: 3 days    Minutes of Exercise per Session: 150+ min  Stress: No Stress Concern Present (01/15/2024)   [employer] of Occupational Health - Occupational Stress Questionnaire    Feeling of Stress : Not at all  Social Connections: Socially Isolated (01/15/2024)   Social Connection and Isolation Panel [NHANES]    Frequency of Communication with Friends and Family: Once a week    Frequency of Social Gatherings with Friends and Family: Three times a week    Attends Religious Services: Never    Active Member of Clubs or Organizations: No    Attends Banker Meetings: Never    Marital Status: Never married  Intimate Partner  Violence: Not At Risk (01/15/2024)   Humiliation, Afraid, Rape, and Kick questionnaire    Fear of Current or Ex-Partner: No    Emotionally Abused: No    Physically Abused: No    Sexually Abused: No    PHYSICAL EXAM  GENERAL EXAM/CONSTITUTIONAL: Vitals:  Vitals:   03/25/24 0806  BP: 121/78  Pulse: 80  Weight: 173 lb 8 oz (78.7 kg)  Height: 5\' 9"  (1.753 m)   Body mass index is 25.62 kg/m. Wt Readings from Last 3 Encounters:  03/25/24 173 lb 8 oz (78.7 kg)  02/03/24 175 lb 4.8 oz (79.5 kg)  01/15/24 175 lb (79.4 kg)   Patient is in no distress; well developed, nourished and groomed; neck is  supple  MUSCULOSKELETAL: Gait, strength, tone, movements noted in Neurologic exam below  NEUROLOGIC: MENTAL STATUS:     01/15/2024   10:07 AM 01/05/2019    9:10 AM  MMSE - Mini Mental State Exam  Not completed: Unable to complete   Orientation to time  5  Orientation to Place  5  Registration  3  Attention/ Calculation  5  Recall  3  Language- name 2 objects  2  Language- repeat  1  Language- follow 3 step command  3  Language- read & follow direction  1  Write a sentence  1  Copy design  1  Total score  30   awake, alert, oriented to person, place and time recent and remote memory intact normal attention and concentration language fluent, comprehension intact, naming intact fund of knowledge appropriate  CRANIAL NERVE:  2nd, 3rd, 4th, 6th - Visual fields full to confrontation, extraocular muscles intact, no nystagmus 5th - facial sensation symmetric 7th - facial strength symmetric 8th - hearing intact 9th - palate elevates symmetrically, uvula midline 11th - shoulder shrug symmetric 12th - tongue protrusion midline  MOTOR:  normal bulk and tone, full strength in the BUE, BLE  SENSORY:  normal and symmetric to light touch  COORDINATION:  finger-nose-finger, fine finger movements normal  GAIT/STATION:  normal     DIAGNOSTIC DATA (LABS, IMAGING, TESTING) - I  reviewed patient records, labs, notes, testing and imaging myself where available.  Lab Results  Component Value Date   WBC 7.7 12/08/2020   HGB 15.0 12/08/2020   HCT 45.0 12/08/2020   MCV 84 12/08/2020   PLT 586 (H) 12/08/2020      Component Value Date/Time   NA 137 12/08/2020 1038   K 4.6 12/08/2020 1038   CL 101 12/08/2020 1038   CO2 24 12/08/2020 1038   GLUCOSE 89 12/08/2020 1038   GLUCOSE 101 (H) 07/12/2014 1620   BUN 12 12/08/2020 1038   CREATININE 1.16 12/08/2020 1038   CREATININE 1.11 07/12/2014 1620   CALCIUM  9.6 12/08/2020 1038   PROT 7.2 12/08/2020 1038   ALBUMIN 4.4 12/08/2020 1038   AST 13 12/08/2020 1038   ALT 16 12/08/2020 1038   ALKPHOS 80 12/08/2020 1038   BILITOT 0.3 12/08/2020 1038   GFRNONAA 72 12/08/2020 1038   GFRAA 83 12/08/2020 1038   Lab Results  Component Value Date   CHOL 166 01/30/2023   HDL 36 (L) 01/30/2023   LDLCALC 104 (H) 01/30/2023   LDLDIRECT 135 (H) 03/17/2012   TRIG 149 01/30/2023   Lab Results  Component Value Date   HGBA1C 5.4 07/12/2014   No results found for: "VITAMINB12" Lab Results  Component Value Date   TSH 2.792 07/12/2014    Head CT 12/21/2023 Normal head CT. No acute intracranial abnormality.   EEG 2017: Normal     ASSESSMENT AND PLAN  58 y.o. year old male  with history of generalized epilepsy, autism spectrum disorder, OCD who is presenting after 2 blackout episodes, last 1 on February 9, per patient, it is brief, no warning signs, no tongue biting or urinary incontinence and no confusion afterward.  I suspect these are syncopal episodes, but due to his history of generalized epilepsy would like to rule out seizures.  He is currently on lamotrigine  200 mg daily, will continue patient on current medication, will check lamotrigine  level with BMP.  I will also obtain routine EEG, if any abnormality will likely increase the lamotrigine .  I will see him  in 1 year for follow-up or sooner if worse.   1. Seizure  disorder (HCC)   2. Therapeutic drug monitoring     Patient Instructions  Continue lamotrigine  200 mg daily Will check lamotrigine  level with BMP Will check updated EEG, I will contact you to go over the result Follow-up in 1 year or sooner if worse.   Per [place] statutes, patients with seizures are not allowed to drive until they have been seizure-free for six months.  Other recommendations include using caution when using heavy equipment or power tools. Avoid working on ladders or at heights. Take showers instead of baths.  Do not swim alone.  Ensure the water temperature is not too high on the home water heater. Do not go swimming alone. Do not lock yourself in a room alone (i.e. bathroom). When caring for infants or small children, sit down when holding, feeding, or changing them to minimize risk of injury to the child in the event you have a seizure. Maintain good sleep hygiene. Avoid alcohol.  Also recommend adequate sleep, hydration, good diet and minimize stress.   During the Seizure  - First, ensure adequate ventilation and place patients on the floor on their left side  Loosen clothing around the neck and ensure the airway is patent. If the patient is clenching the teeth, do not force the mouth open with any object as this can cause severe damage - Remove all items from the surrounding that can be hazardous. The patient may be oblivious to what's happening and may not even know what he or she is doing. If the patient is confused and wandering, either gently guide him/her away and block access to outside areas - Reassure the individual and be comforting - Call 911. In most cases, the seizure ends before EMS arrives. However, there are cases when seizures may last over 3 to 5 minutes. Or the individual may have developed breathing difficulties or severe injuries. If a pregnant patient or a person with diabetes develops a seizure, it is prudent to call an ambulance. -  Finally, if the patient does not regain full consciousness, then call EMS. Most patients will remain confused for about 45 to 90 minutes after a seizure, so you must use judgment in calling for help. - Avoid restraints but make sure the patient is in a bed with padded side rails - Place the individual in a lateral position with the neck slightly flexed; this will help the saliva drain from the mouth and prevent the tongue from falling backward - Remove all nearby furniture and other hazards from the area - Provide verbal assurance as the individual is regaining consciousness - Provide the patient with privacy if possible - Call for help and start treatment as ordered by the caregiver   After the Seizure (Postictal Stage)  After a seizure, most patients experience confusion, fatigue, muscle pain and/or a headache. Thus, one should permit the individual to sleep. For the next few days, reassurance is essential. Being calm and helping reorient the person is also of importance.  Most seizures are painless and end spontaneously. Seizures are not harmful to others but can lead to complications such as stress on the lungs, brain and the heart. Individuals with prior lung problems may develop labored breathing and respiratory distress.    Discussed Patients with epilepsy have a small risk of sudden unexpected death, a condition referred to as sudden unexpected death in epilepsy (SUDEP). SUDEP is defined specifically as the sudden,  unexpected, witnessed or unwitnessed, nontraumatic and nondrowning death in patients with epilepsy with or without evidence for a seizure, and excluding documented status epilepticus, in which post mortem examination does not reveal a structural or toxicologic cause for death     Orders Placed This Encounter  Procedures   Lamotrigine  level   Basic Metabolic Panel   EEG adult    No orders of the defined types were placed in this encounter.   Return in about 1 year  (around 03/25/2025).    Cassandra Cleveland, MD 03/25/2024, 8:44 AM  [facility] [address], [address] [place], [place] [address] [phone]

## 2024-03-29 ENCOUNTER — Other Ambulatory Visit: Payer: Self-pay | Admitting: Student

## 2024-03-30 ENCOUNTER — Ambulatory Visit: Payer: Self-pay | Admitting: Neurology

## 2024-03-30 LAB — LAMOTRIGINE LEVEL: Lamotrigine Lvl: 16 ug/mL (ref 2.0–20.0)

## 2024-03-30 LAB — BASIC METABOLIC PANEL WITH GFR
BUN/Creatinine Ratio: 11 (ref 9–20)
BUN: 12 mg/dL (ref 6–24)
CO2: 24 mmol/L (ref 20–29)
Calcium: 9.8 mg/dL (ref 8.7–10.2)
Chloride: 100 mmol/L (ref 96–106)
Creatinine, Ser: 1.05 mg/dL (ref 0.76–1.27)
Glucose: 98 mg/dL (ref 70–99)
Potassium: 4.9 mmol/L (ref 3.5–5.2)
Sodium: 140 mmol/L (ref 134–144)
eGFR: 84 mL/min/{1.73_m2} (ref >59)

## 2024-04-01 ENCOUNTER — Other Ambulatory Visit: Admitting: *Deleted

## 2024-04-06 ENCOUNTER — Ambulatory Visit (INDEPENDENT_AMBULATORY_CARE_PROVIDER_SITE_OTHER): Admitting: Neurology

## 2024-04-06 DIAGNOSIS — G40909 Epilepsy, unspecified, not intractable, without status epilepticus: Secondary | ICD-10-CM

## 2024-04-06 NOTE — Procedures (Signed)
    History:  58 year old man with seizure. EEG for background classification   EEG classification: Awake and drowsy  Duration: 26 minutes   Technical aspects: This EEG study was done with scalp electrodes positioned according to the 10-20 International system of electrode placement. Electrical activity was reviewed with band pass filter of 1-70Hz , sensitivity of 7 uV/mm, display speed of 63mm/sec with a 60Hz  notched filter applied as appropriate. EEG data were recorded continuously and digitally stored.   Description of the recording: The background rhythms of this recording consists of a fairly well modulated medium amplitude alpha rhythm of 9 Hz that is reactive to eye opening and closure. Present in the anterior head region is a 15-20 Hz beta activity. Photic stimulation was performed, did not show any abnormalities. Hyperventilation was also performed, did not show any abnormalities. Drowsiness was manifested by background fragmentation. No abnormal epileptiform discharges seen during this recording. There was no focal slowing. There were no electrographic seizure identified.   Abnormality: None   Impression: This is a normal awake and drowsy EEG. No evidence of interictal epileptiform discharges. Normal EEGs, however, do not rule out epilepsy.    Consandra Laske, MD [facility]

## 2024-06-15 ENCOUNTER — Other Ambulatory Visit: Payer: Self-pay | Admitting: Student

## 2024-07-16 ENCOUNTER — Other Ambulatory Visit: Payer: Self-pay | Admitting: Student

## 2024-08-02 ENCOUNTER — Other Ambulatory Visit: Payer: Self-pay | Admitting: Student

## 2024-08-12 ENCOUNTER — Other Ambulatory Visit: Payer: Self-pay | Admitting: Student

## 2024-12-09 ENCOUNTER — Other Ambulatory Visit: Payer: Self-pay | Admitting: Student

## 2024-12-09 DIAGNOSIS — E785 Hyperlipidemia, unspecified: Secondary | ICD-10-CM

## 2025-01-17 ENCOUNTER — Encounter

## 2025-02-02 ENCOUNTER — Ambulatory Visit: Admitting: Nurse Practitioner

## 2025-02-02 ENCOUNTER — Other Ambulatory Visit

## 2025-03-28 ENCOUNTER — Ambulatory Visit: Admitting: Neurology

## 2025-03-31 ENCOUNTER — Ambulatory Visit: Admitting: Neurology
